# Patient Record
Sex: Female | Born: 1960
Health system: Southern US, Community
[De-identification: ages and names within clinical notes are randomized; demographics above are authoritative.]

## PROBLEM LIST (undated history)

## (undated) DIAGNOSIS — F32A Depression, unspecified: Secondary | ICD-10-CM

## (undated) DIAGNOSIS — M1990 Unspecified osteoarthritis, unspecified site: Secondary | ICD-10-CM

## (undated) DIAGNOSIS — I493 Ventricular premature depolarization: Secondary | ICD-10-CM

## (undated) DIAGNOSIS — IMO0001 Reserved for inherently not codable concepts without codable children: Secondary | ICD-10-CM

## (undated) DIAGNOSIS — I10 Essential (primary) hypertension: Secondary | ICD-10-CM

## (undated) DIAGNOSIS — M797 Fibromyalgia: Secondary | ICD-10-CM

## (undated) DIAGNOSIS — K589 Irritable bowel syndrome without diarrhea: Secondary | ICD-10-CM

## (undated) DIAGNOSIS — T8859XA Other complications of anesthesia, initial encounter: Secondary | ICD-10-CM

## (undated) DIAGNOSIS — J45909 Unspecified asthma, uncomplicated: Secondary | ICD-10-CM

## (undated) DIAGNOSIS — K219 Gastro-esophageal reflux disease without esophagitis: Secondary | ICD-10-CM

## (undated) DIAGNOSIS — F419 Anxiety disorder, unspecified: Secondary | ICD-10-CM

## (undated) DIAGNOSIS — M549 Dorsalgia, unspecified: Secondary | ICD-10-CM

## (undated) DIAGNOSIS — M4300 Spondylolysis, site unspecified: Secondary | ICD-10-CM

## (undated) DIAGNOSIS — K5792 Diverticulitis of intestine, part unspecified, without perforation or abscess without bleeding: Secondary | ICD-10-CM

## (undated) DIAGNOSIS — R112 Nausea with vomiting, unspecified: Secondary | ICD-10-CM

## (undated) DIAGNOSIS — Z9889 Other specified postprocedural states: Secondary | ICD-10-CM

## (undated) DIAGNOSIS — I499 Cardiac arrhythmia, unspecified: Secondary | ICD-10-CM

## (undated) HISTORY — PX: FINGER SURGERY: SHX640

## (undated) HISTORY — PX: CARPAL TUNNEL RELEASE: SHX101

## (undated) HISTORY — PX: KNEE SURGERY: SHX244

## (undated) HISTORY — PX: CHOLECYSTECTOMY: SHX55

## (undated) SURGERY — COLONOSCOPY
Anesthesia: Moderate Sedation

---

## 2000-10-17 ENCOUNTER — Emergency Department (HOSPITAL_COMMUNITY): Admission: EM | Admit: 2000-10-17 | Discharge: 2000-10-17 | Payer: Self-pay | Admitting: *Deleted

## 2000-10-17 ENCOUNTER — Encounter: Payer: Self-pay | Admitting: Emergency Medicine

## 2002-06-15 ENCOUNTER — Encounter: Payer: Self-pay | Admitting: Pediatrics

## 2002-06-15 ENCOUNTER — Ambulatory Visit (HOSPITAL_COMMUNITY): Admission: RE | Admit: 2002-06-15 | Discharge: 2002-06-15 | Payer: Self-pay | Admitting: Pediatrics

## 2004-05-10 ENCOUNTER — Ambulatory Visit (HOSPITAL_COMMUNITY): Admission: RE | Admit: 2004-05-10 | Discharge: 2004-05-10 | Payer: Self-pay | Admitting: Family Medicine

## 2004-11-11 ENCOUNTER — Ambulatory Visit (HOSPITAL_COMMUNITY): Admission: RE | Admit: 2004-11-11 | Discharge: 2004-11-11 | Payer: Self-pay | Admitting: Pediatrics

## 2005-08-07 ENCOUNTER — Ambulatory Visit (HOSPITAL_COMMUNITY): Admission: RE | Admit: 2005-08-07 | Discharge: 2005-08-07 | Payer: Self-pay | Admitting: Family Medicine

## 2005-12-15 ENCOUNTER — Emergency Department (HOSPITAL_COMMUNITY): Admission: EM | Admit: 2005-12-15 | Discharge: 2005-12-15 | Payer: Self-pay | Admitting: Emergency Medicine

## 2006-01-25 ENCOUNTER — Ambulatory Visit: Payer: Self-pay | Admitting: Internal Medicine

## 2006-01-26 ENCOUNTER — Ambulatory Visit: Payer: Self-pay | Admitting: Internal Medicine

## 2006-01-29 ENCOUNTER — Ambulatory Visit (HOSPITAL_COMMUNITY): Admission: RE | Admit: 2006-01-29 | Discharge: 2006-01-29 | Payer: Self-pay | Admitting: Internal Medicine

## 2006-03-10 ENCOUNTER — Ambulatory Visit: Payer: Self-pay | Admitting: Internal Medicine

## 2006-03-19 ENCOUNTER — Ambulatory Visit: Payer: Self-pay | Admitting: Internal Medicine

## 2006-03-19 ENCOUNTER — Ambulatory Visit (HOSPITAL_COMMUNITY): Admission: RE | Admit: 2006-03-19 | Discharge: 2006-03-19 | Payer: Self-pay | Admitting: Internal Medicine

## 2006-03-19 ENCOUNTER — Encounter (INDEPENDENT_AMBULATORY_CARE_PROVIDER_SITE_OTHER): Payer: Self-pay | Admitting: *Deleted

## 2006-04-09 ENCOUNTER — Ambulatory Visit: Payer: Self-pay | Admitting: Internal Medicine

## 2007-02-04 ENCOUNTER — Emergency Department (HOSPITAL_COMMUNITY): Admission: EM | Admit: 2007-02-04 | Discharge: 2007-02-04 | Payer: Self-pay | Admitting: Emergency Medicine

## 2007-03-18 ENCOUNTER — Other Ambulatory Visit: Admission: RE | Admit: 2007-03-18 | Discharge: 2007-03-18 | Payer: Self-pay | Admitting: Obstetrics & Gynecology

## 2007-09-02 ENCOUNTER — Emergency Department (HOSPITAL_COMMUNITY): Admission: EM | Admit: 2007-09-02 | Discharge: 2007-09-02 | Payer: Self-pay | Admitting: Emergency Medicine

## 2008-05-14 ENCOUNTER — Other Ambulatory Visit: Admission: RE | Admit: 2008-05-14 | Discharge: 2008-05-14 | Payer: Self-pay | Admitting: Obstetrics and Gynecology

## 2009-04-23 ENCOUNTER — Ambulatory Visit (HOSPITAL_COMMUNITY): Admission: RE | Admit: 2009-04-23 | Discharge: 2009-04-23 | Payer: Self-pay | Admitting: Pediatrics

## 2009-12-16 ENCOUNTER — Ambulatory Visit (HOSPITAL_COMMUNITY): Admission: RE | Admit: 2009-12-16 | Discharge: 2009-12-16 | Payer: Self-pay | Admitting: Pediatrics

## 2009-12-17 ENCOUNTER — Ambulatory Visit (HOSPITAL_COMMUNITY): Admission: RE | Admit: 2009-12-17 | Discharge: 2009-12-17 | Payer: Self-pay | Admitting: Pediatrics

## 2010-02-26 ENCOUNTER — Other Ambulatory Visit
Admission: RE | Admit: 2010-02-26 | Discharge: 2010-02-26 | Payer: Self-pay | Source: Home / Self Care | Admitting: Obstetrics & Gynecology

## 2010-03-10 ENCOUNTER — Encounter
Admission: RE | Admit: 2010-03-10 | Discharge: 2010-03-10 | Payer: Self-pay | Source: Home / Self Care | Attending: Obstetrics & Gynecology | Admitting: Obstetrics & Gynecology

## 2010-07-11 NOTE — Op Note (Signed)
NAME:  Heather Delgado, Heather Delgado                  ACCOUNT NO.:  [id number]   MEDICAL RECORD NO.:  [id number]          PATIENT TYPE:  AMB   LOCATION:  DAY                           FACILITY:  [facility]   PHYSICIAN:  Lionel December, M.D.    DATE OF BIRTH:  1960/06/22   DATE OF PROCEDURE:  03/19/2006  DATE OF DISCHARGE:                               OPERATIVE REPORT   PROCEDURE:  Colonoscopy with terminal ileoscopy.   INDICATIONS:  [name] is a 50 year old Caucasian female who was treated  for diverticulitis towards end of October, she required four weeks of  therapy.  Follow-up CT showed resolution of changes of diverticulitis  but she still remains with some discomfort and diarrhea.  She is  undergoing diagnostic colonoscopy.  Family history is negative for  colorectal carcinoma but positive for Crohn disease in her second-degree  relative.  Procedure risks were reviewed with the patient, informed  consent was obtained.   MEDS FOR CONSCIOUS SEDATION:  Demerol 50 mg IV, Versed 10 mg IV.   FINDINGS:  Procedure performed in endoscopy suite.  The patient's vital  signs and O2 sat were monitored during procedure and remained stable.  The patient was placed left lateral recumbent position and rectal  examination performed.  No abnormality noted on external or digital  exam.  Pentax videoscope was placed in rectum and advanced under vision  into sigmoid colon beyond.  Preparation was excellent.  She had  scattered diverticula at sigmoid and descending colon.  Scope was passed  into cecum which was identified by appendiceal orifice, ileocecal valve.  T I was examined for at least 10 cm and was normal.  As the scope was  withdrawn colonic mucosa was once again carefully examined and was  normal throughout.  Random biopsies taken from the sigmoid colon looking  for microscopic colitis.  Rectal mucosa was normal.  Scope was  retroflexed to examine anorectal junction which was unremarkable.  Endoscope was  straightened and withdrawn.  The patient tolerated the  procedure well.   FINAL DIAGNOSIS:  1. Few scattered diverticula at sigmoid and descending colon.  2. No evidence of colitis or ileitis.  3. Evidence of colitis or terminal ileitis.  4. Random biopsy taken from sigmoid colon looking for      microscopic/collagenous colitis.   RECOMMENDATIONS:  She will resume her usual meds and diet.  I will be  contacting patient with results of biopsy and further recommendations.      Lionel December, M.D.  Electronically Signed     NR/MEDQ  D:  03/19/2006  T:  03/19/2006  Job:  [id number]   cc:   ?

## 2010-07-11 NOTE — Consult Note (Signed)
NAME:  Heather Delgado, Heather Delgado                  ACCOUNT NO.:  [id number]   MEDICAL RECORD NO.:  [id number]           PATIENT TYPE:  AMB   LOCATION:                                FACILITY:  [facility]   PHYSICIAN:  Lionel December, M.D.    DATE OF BIRTH:  November 25, 1960   DATE OF CONSULTATION:  01/25/2006  DATE OF DISCHARGE:                                 CONSULTATION   PRESENTING COMPLAINT:  Persistent left-sided abdominal pain in a patient  recently treated for diverticulitis who also has diarrhea.   Heather Delgado is a 50 year old Caucasian female who is being evaluated at  request by Dr. Milford Cage, the patient's primary care physician.  The patient  states that she has had diarrhea since January this year.  She had stool  studies at that time by Dr. Santiago Bumpers which were all negative.  She  has been dealing with this symptom.  She states she would get better for  a few days, and she actually would have normal stools.  However, things  changed about 6 weeks ago when she developed excruciating pain along the  left side of her abdomen, mainly under the left rib cage.  She was seen  in emergency room at [facility] that night.  She had abdominopelvic CT which  revealed mild thickening to mid left colon with associated  diverticulosis, edema in the pericolonic fat.  These findings were felt  to be consistent with diverticulitis.  This study also showed a 3-mm  stone in lower pole of left kidney.  The patient was treated with Cipro  and Flagyl for 2 weeks.  She thought she was getting better.  Her pain  relapsed 1 day after stopping the medicine.  She was therefore retreated  with Cipro for 2 weeks, and it was followed by a 10-day course of  clindamycin which she finished about 10 days ago.  She states sharp pain  has resolved, but she still has soreness in her left mid and left upper  quadrant of her abdomen.  Previously, she was having intermittent  diarrhea, and now she is having seven to eight stools daily.  She has  had  nocturnal bowel movements, none in the last week.  Her stools are  loose.  She has had urgency, but she denies melena or rectal bleeding.  She had hemoccult done at time of examination by her gynecologist in  October and was negative.  She feels this attack was exacerbated by her  going on a low carb diet in an effort to lose weight which she did about  6 weeks prior to onset of this pain.  Her appetite was down, but now it  is back to normal.  She has intermittent heartburn usually associated  certain foods.  At times, she has taken medicines but not regularly.  She has nausea which she has had for years, and it reminds her of  morning sickness.  She denies vomiting, fever or chills.   She is on:  1. Benicar 40 mg daily.  2. Lasix 40 mg 3 times a week.  3. Bupropion SR 150 mg 2 a day.  4. Kariva birth control pills daily.  5. __________  tropical p.r.n., usually every other day.  6. Pepcid AC 10 mg q.h.s. p.r.n.  7. Compazine p.r.n.  8. Ventolin inhaler.  9. Azmacort p.r.n.Marland Kitchen   PAST MEDICAL HISTORY:  History of NASH.  Her initial biopsy was back in  July 1995 by Dr. Jena Gauss.  At that time, she had ERCP prior to it which  was normal.  She had a second liver biopsy in July 2000 at the time of  laparoscopic cholecystectomy, and once again, she had findings of NASH  with very fine filamentous intralobular fibrosis (chicken-wire pattern).   She has had three ERCPs, first one which was in July 1995 which was  normal.  She presented in July 2000 with biliary colic and transaminases  over 1000.  Her ERCP was normal.  It was felt that she may have passed a  small stone.  She had laparoscopic cholecystectomy following which she  had another ERCP with sphincterotomy and balloon passage.  In  retrospect, it is possible that she either had microlithiasis or  sphincter of Oddi dysfunction.  It is interesting that her transaminases  have remained normal since her sphincterotomy.   She has history  of hypoglycemia, depression, bronchial asthma and  autoimmune skin disease, i.e. lichen __________  involving perianal and  vulvar skin.  She uses a cream, usually treated four times a week.   She had left knee arthroscopy in 1995, C-section in 99 and  cholecystectomy in July 2000 as above.   ALLERGIES:  To Maxzide - caused almost anaphylaxis.  She was told that  it might have been due to sulfa in this medicine.   FAMILY HISTORY:  Mother has history of colonic polyps, and she is also  felt to have NASH.  Father is quadriplegic secondary to traumatic spinal  cord injury.  She has a brother with hypertension.  Her paternal uncle  has Crohn's disease.   She is married.  She has a daughter and a Museum/gallery conservator.  She works as a  Armed forces operational officer.  She has never smoked cigarettes and does not drink  alcohol.   PHYSICAL EXAMINATION:  Pleasant, well-developed, obese, Caucasian female  who is in no acute distress.  She weighs 294 pounds.  She is 5 feet 5  inches tall.  Pulse 92 per minute, blood pressure 138/90, temperature is  98.9.  Conjunctivae is pink.  Sclerae is nonicteric.  Oropharyngeal mucosa is  normal.  No neck masses are noted.  CARDIAC EXAM:  With a regular rhythm.  Normal S1-S2.  No murmur or  gallop noted.  LUNGS:  Clear to auscultation.  ABDOMEN:  Is protuberant.  Bowel sounds are normal.  On palpation, she  has mild tenderness at left lower and left upper quadrant without  guarding or rebound.  No masses or organomegaly noted.  RECTAL EXAMINATION:  Deferred.  She does not have peripheral edema or  clubbing.   Lab is not available for review, but she tells me her CBC and LFTs are  normal.   CT findings as above.   ASSESSMENT:  [name] is 50 year old Caucasian female who presents with  several month history of nonbloody diarrhea who was treated for  diverticulitis about 6 weeks ago but required over 5 weeks of antibiotic therapy.  Her severe pain has resolved, but she  still has some residual  discomfort on the left side of her abdomen.  Her  diarrhea has gotten  worse.  She received 4 weeks of Cipro and Flagyl and 10 days of  clindamycin.   As far as the chronic diarrhea is concerned, I suspect she has irritable  bowel syndrome.  Now that it has gotten worse and occurring daily, need  to rule out antibiotic-induced colitis, particularly considering  treatment with clindamycin.  She could also have inflammatory bowel  disease.   Since she is still having left-sided pain, I feel a follow-up CT is  indicated to make sure all of these changes have resolved prior to  considering colonoscopy.   Chronic/intermittent nausea, possibly unrelated to her GI problem.   History of NASH (biopsy-proven).  Transaminases have remained normal in  the last 6 or 7 years.   RECOMMENDATIONS:  1. Hemoccult x1.  2. Stool for WBC and Clostridium difficile toxin titer.  3. Abdominopelvic CT with oral and IV contrast for follow-up of      diverticulitis since she is still having discomfort and is mildly      tender.  4. Levbid 1 tablet every morning.  Prescription given for 30 with 5      refills.  5. Need and timing of colonoscopy will be deferred until above      completed and reviewed.   We would like to thank Dr. Milford Cage for the opportunity to participate in  the care of this nice lady.      Lionel December, M.D.  Electronically Signed     NR/MEDQ  D:  01/25/2006  T:  01/26/2006  Job:  [id number]   cc:   Francoise Schaumann. Milford Cage DO, FAAP  Fax: [phone]   [facility]  Fax: [phone]

## 2010-07-11 NOTE — H&P (Signed)
NAME:  [name], [name]                  ACCOUNT NO.:  [id number]   MEDICAL RECORD NO.:  [id number]          PATIENT TYPE:  AMB   LOCATION:  DAY                           FACILITY:  [facility]   PHYSICIAN:  Lionel December, M.D.    DATE OF BIRTH:  30-May-1960   DATE OF ADMISSION:  03/10/2006  DATE OF DISCHARGE:  LH                              HISTORY & PHYSICAL   PRESENTING COMPLAINT:  Follow-up for left-sided abdominal pain and  diverticulitis.   HISTORY OF PRESENT ILLNESS:  Heather Delgado is 50 year old Caucasian female who  was last seen on January 25, 2006 per request by Dr. Milford Cage for persistent  left sided pain and diarrhea in a patient who recently was treated for  diverticulitis.  She had received a total of 4 weeks of Cipro and Flagyl  and 10 days of clindamycin.  Her initial CT was on December 15, 2005.  She never had complete recovery.  It was felt that she may also have  IBS.  She had stool studies to rule out antibiotic induced colitis.  Her  C.  Diff toxin titer was negative and her lactoferrin was also negative.  She had negative Hemoccult.  She was begun on Levbid.  She had follow-up  CT on January 29, 2006 which revealed complete resolution of changes of  diverticulitis that were seen on prior study of December 15, 2005.  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  These findings were  reviewed with the patient over the phone last month.   She also had left ovarian cyst which measured 30 x 27 mm.  She also had  mild sigmoid diverticulosis.  Please note that this cyst was not seen  study of December 15, 2005.   Patient feels better.  She is still having some cramps when she has  diarrhea.  She may have diarrhea for a couple of days in a row and then  she has normal or semi-formed stool.  On days she has diarrhea.  She may  have six to eight BM's.  She denies melena or rectal bleeding.  She also  denies  fever or chills.  She ran out of her Levbid 2 days ago.  She can  tell a big difference.  She feels Levbid has helped her a great deal.  She had her pelvic exam 3-4 months ago which was normal by her  gynecologist in [place], [place].  She states she has been previously  evaluated for microscopic hematuria by Dr. Earlene Plater but the workup was  negative.  And now she has two tiny stones.   MEDICATIONS:  1. Benicar 40 mg daily.  2. Lasix 40 mg three times a week.  3. Bupropion SR 300 mg daily.  4. Kariva birth control pills daily.  5. Embeline topical cream p.r.n.  6. Pepcid AC one q.h.s.  7. Compazine p.r.n. dose unknown.  8. Ventolin inhaler 2 puffs q.i.d. p.r.n.  9. Azmacort inhaler p.r.n.  10.Levbid 1 tablet every morning.  11.Fiber supplement 3-4 grams  per day.   PAST MEDICAL HISTORY:  Please refer to my note of January 25, 2006 which  is on EMR and will not be repeated.   ALLERGIES:  To MAXZIDE, MERCURY and SULFA.   FAMILY HISTORY:  Family history positive for colonic polyps, NASH in  mother and paternal uncle has Crohn's disease.   SOCIAL HISTORY:  She is married.  She has biologic and a Museum/gallery conservator.  She works as a Armed forces operational officer.  She does not smoke cigarettes or drink  alcohol.   OBJECTIVE:  VITAL SIGNS:  Weight 297 pounds.  She is 5 feet 5 inches  tall.  Pulse 80 per minute, blood pressure of 136/88, temperature is  98.8.  HEENT:  Conjunctivae is pink and sclerae is anicteric.  No neck masses  are noted.  HEART:  Within normal limits.  ABDOMEN:  Her abdomen is obese, bowel sounds are normal on palpation. It  is soft and nontender without organomegaly or masses and no peripheral  edema noted.   ASSESSMENT:  1. Intermittent diarrhea and left-sided abdominal cramps felt to be      secondary to irritable bowel syndrome.  It appears as      diverticulitis has completely resolved as evidenced on follow-up CT      of January 25, 2006.  She had partial response to the  IBS therapy.  2. Left ovarian cyst.  She was given copy of CT report and she will      take it to her gynecologist in [place], [place].  3. Two small left renal calculi.  She will make arrangements to see      Dr. Earlene Plater.  We will send him the CT report.   PLAN:  Colonoscopy to be performed at [facility] in the near future.   She will go back on Levbid 1 tablet p.o. q.a.m., continue fiber  supplement at 3-4 grams per day.  Next loperamide 1-2 mg p.o. q.a.m.   The patient will call Dr. Earlene Plater' office for a visit regarding her renal  calculi.      Lionel December, M.D.  Electronically Signed     NR/MEDQ  D:  03/10/2006  T:  03/10/2006  Job:  [id number]   cc:   Francoise Schaumann. Milford Cage DO, FAAP  Fax: [phone]   Lucrezia Starch. Earlene Plater, M.D.  Fax: [phone]

## 2010-08-06 ENCOUNTER — Other Ambulatory Visit: Payer: Self-pay | Admitting: Obstetrics & Gynecology

## 2010-08-06 DIAGNOSIS — N63 Unspecified lump in unspecified breast: Secondary | ICD-10-CM

## 2010-09-05 ENCOUNTER — Ambulatory Visit
Admission: RE | Admit: 2010-09-05 | Discharge: 2010-09-05 | Disposition: A | Discharge status: Home / Self Care | Payer: BC Managed Care – PPO | Source: Ambulatory Visit | Attending: Obstetrics & Gynecology | Admitting: Obstetrics & Gynecology

## 2010-09-05 DIAGNOSIS — N63 Unspecified lump in unspecified breast: Secondary | ICD-10-CM

## 2010-11-20 LAB — CBC
HCT: 43.9
MCHC: 34.6
MCV: 87.9
Platelets: 299
RDW: 13.2
WBC: 6.8

## 2010-11-20 LAB — URINE MICROSCOPIC-ADD ON

## 2010-11-20 LAB — DIFFERENTIAL
Basophils Absolute: 0
Eosinophils Relative: 3
Lymphocytes Relative: 32
Lymphs Abs: 2.2
Monocytes Absolute: 0.6
Monocytes Relative: 9
Neutro Abs: 3.8

## 2010-11-20 LAB — COMPREHENSIVE METABOLIC PANEL
AST: 26
Albumin: 3.9
BUN: 13
Calcium: 9.1
Chloride: 106
Creatinine, Ser: 0.83
GFR calc Af Amer: >60
Total Protein: 6.6

## 2010-11-20 LAB — URINALYSIS, ROUTINE W REFLEX MICROSCOPIC
Glucose, UA: NEGATIVE
Ketones, ur: NEGATIVE
Leukocytes, UA: NEGATIVE
Specific Gravity, Urine: 1.002 — ABNORMAL LOW
pH: 7

## 2011-02-19 ENCOUNTER — Other Ambulatory Visit: Payer: Self-pay | Admitting: Obstetrics & Gynecology

## 2011-02-19 DIAGNOSIS — N6489 Other specified disorders of breast: Secondary | ICD-10-CM

## 2011-03-16 ENCOUNTER — Ambulatory Visit
Admission: RE | Admit: 2011-03-16 | Discharge: 2011-03-16 | Disposition: A | Discharge status: Home / Self Care | Payer: BC Managed Care – PPO | Source: Ambulatory Visit | Attending: Obstetrics & Gynecology | Admitting: Obstetrics & Gynecology

## 2011-03-16 DIAGNOSIS — N6489 Other specified disorders of breast: Secondary | ICD-10-CM

## 2011-04-01 ENCOUNTER — Other Ambulatory Visit: Payer: Self-pay | Admitting: Obstetrics & Gynecology

## 2011-04-01 ENCOUNTER — Other Ambulatory Visit (HOSPITAL_COMMUNITY)
Admission: RE | Admit: 2011-04-01 | Discharge: 2011-04-01 | Disposition: A | Discharge status: Home / Self Care | Payer: BC Managed Care – PPO | Source: Ambulatory Visit | Attending: Obstetrics & Gynecology | Admitting: Obstetrics & Gynecology

## 2011-04-01 DIAGNOSIS — Z01419 Encounter for gynecological examination (general) (routine) without abnormal findings: Secondary | ICD-10-CM | POA: Insufficient documentation

## 2011-09-07 ENCOUNTER — Emergency Department (HOSPITAL_COMMUNITY)
Admission: EM | Admit: 2011-09-07 | Discharge: 2011-09-07 | Disposition: A | Discharge status: Home / Self Care | Payer: BC Managed Care – PPO | Attending: Emergency Medicine | Admitting: Emergency Medicine

## 2011-09-07 ENCOUNTER — Emergency Department (HOSPITAL_COMMUNITY): Payer: BC Managed Care – PPO

## 2011-09-07 ENCOUNTER — Encounter (HOSPITAL_COMMUNITY): Payer: Self-pay | Admitting: *Deleted

## 2011-09-07 DIAGNOSIS — R5383 Other fatigue: Secondary | ICD-10-CM | POA: Insufficient documentation

## 2011-09-07 DIAGNOSIS — K219 Gastro-esophageal reflux disease without esophagitis: Secondary | ICD-10-CM | POA: Insufficient documentation

## 2011-09-07 DIAGNOSIS — R0602 Shortness of breath: Secondary | ICD-10-CM | POA: Insufficient documentation

## 2011-09-07 DIAGNOSIS — R51 Headache: Secondary | ICD-10-CM | POA: Insufficient documentation

## 2011-09-07 DIAGNOSIS — I10 Essential (primary) hypertension: Secondary | ICD-10-CM | POA: Insufficient documentation

## 2011-09-07 DIAGNOSIS — R079 Chest pain, unspecified: Secondary | ICD-10-CM | POA: Insufficient documentation

## 2011-09-07 DIAGNOSIS — Z7982 Long term (current) use of aspirin: Secondary | ICD-10-CM | POA: Insufficient documentation

## 2011-09-07 DIAGNOSIS — Z79899 Other long term (current) drug therapy: Secondary | ICD-10-CM | POA: Insufficient documentation

## 2011-09-07 DIAGNOSIS — R002 Palpitations: Secondary | ICD-10-CM | POA: Insufficient documentation

## 2011-09-07 DIAGNOSIS — R5381 Other malaise: Secondary | ICD-10-CM | POA: Insufficient documentation

## 2011-09-07 HISTORY — DX: Gastro-esophageal reflux disease without esophagitis: K21.9

## 2011-09-07 HISTORY — DX: Diverticulitis of intestine, part unspecified, without perforation or abscess without bleeding: K57.92

## 2011-09-07 HISTORY — DX: Dorsalgia, unspecified: M54.9

## 2011-09-07 HISTORY — DX: Essential (primary) hypertension: I10

## 2011-09-07 HISTORY — DX: Reserved for inherently not codable concepts without codable children: IMO0001

## 2011-09-07 LAB — CARDIAC PANEL(CRET KIN+CKTOT+MB+TROPI)
CK, MB: 1.8 ng/mL (ref 0.3–4.0)
Relative Index: INVALID (ref 0.0–2.5)
Total CK: 38 U/L (ref 7–177)
Troponin I: <0.3 ng/mL (ref <0.30)

## 2011-09-07 LAB — CBC WITH DIFFERENTIAL/PLATELET
Basophils Absolute: 0 10*3/uL (ref 0.0–0.1)
Basophils Relative: 0 % (ref 0–1)
Eosinophils Absolute: 0.1 10*3/uL (ref 0.0–0.7)
Eosinophils Relative: 1 % (ref 0–5)
Lymphocytes Relative: 24 % (ref 12–46)
MCHC: 33.4 g/dL (ref 30.0–36.0)
MCV: 86.7 fL (ref 78.0–100.0)
Monocytes Absolute: 0.7 10*3/uL (ref 0.1–1.0)
Platelets: 304 10*3/uL (ref 150–400)
RDW: 13.7 % (ref 11.5–15.5)
WBC: 9.3 10*3/uL (ref 4.0–10.5)

## 2011-09-07 LAB — COMPREHENSIVE METABOLIC PANEL
ALT: 23 U/L (ref 0–35)
AST: 17 U/L (ref 0–37)
Albumin: 3.4 g/dL — ABNORMAL LOW (ref 3.5–5.2)
CO2: 27 mEq/L (ref 19–32)
Calcium: 9.9 mg/dL (ref 8.4–10.5)
Creatinine, Ser: 0.55 mg/dL (ref 0.50–1.10)
Sodium: 139 mEq/L (ref 135–145)
Total Protein: 6.9 g/dL (ref 6.0–8.3)

## 2011-09-07 LAB — RAPID URINE DRUG SCREEN, HOSP PERFORMED
Barbiturates: NOT DETECTED
Benzodiazepines: NOT DETECTED

## 2011-09-07 NOTE — ED Provider Notes (Signed)
History     CSN: [id number]  Arrival date & time 09/07/11  1327   First MD Initiated Contact with Patient 09/07/11 1348      Chief Complaint  Patient presents with  . Palpitations    (Consider location/radiation/quality/duration/timing/severity/associated sxs/prior treatment) HPI Comments: Intermittent palpitations and "heart fluttering" for the past week. Improved with decreasing caffeine intake but did not resolve. Patient wonders if steroid injections and knees but may be responsible. She denies any nausea or vomiting. She has mild shortness of breath and feels some anterior chest pain with deep breathing. She had fleeting seconds of left clavicle pain intermittently today to have resolved. She denies any history of heart disease or lung disease. She has been on thyroid medication in the past was told she could stop at several years ago.  The history is provided by the patient.    Past Medical History  Diagnosis Date  . Hypertension   . Reflux   . Diverticulitis   . Back pain     Past Surgical History  Procedure Date  . Cholecystectomy   . Cesarean section   . Knee surgery     History reviewed. No pertinent family history.  History  Substance Use Topics  . Smoking status: Never Smoker   . Smokeless tobacco: Not on file  . Alcohol Use: No    OB History    Grav Para Term Preterm Abortions TAB SAB Ect Mult Living                  Review of Systems  Constitutional: Positive for fatigue. Negative for fever, activity change and appetite change.  HENT: Negative for congestion and rhinorrhea.   Respiratory: Negative for cough, chest tightness and shortness of breath.   Cardiovascular: Positive for palpitations. Negative for chest pain.  Gastrointestinal: Negative for nausea, vomiting and abdominal pain.  Genitourinary: Negative for dysuria, vaginal bleeding and vaginal discharge.  Musculoskeletal: Negative for back pain.  Neurological: Positive for weakness and  headaches. Negative for dizziness and light-headedness.    Allergies  Sulfa antibiotics; Triamterene; and Other  Home Medications   Current Outpatient Rx  Name Route Sig Dispense Refill  . ASPIRIN EC 81 MG PO TBEC Oral Take 81 mg by mouth daily.    Marland Kitchen CLOBETASOL PROPIONATE 0.05 % EX OINT Topical Apply 1 application topically daily as needed. Likens Disease    . DULOXETINE HCL 60 MG PO CPEP Oral Take 60 mg by mouth daily.    Marland Kitchen ESOMEPRAZOLE MAGNESIUM 40 MG PO CPDR Oral Take 40 mg by mouth at bedtime.    Marland Kitchen LISINOPRIL 20 MG PO TABS Oral Take 20 mg by mouth at bedtime.    Marland Kitchen LORATADINE 10 MG PO TABS Oral Take 10 mg by mouth at bedtime.    Marland Kitchen MAGNESIUM 250 MG PO TABS Oral Take 1 tablet by mouth daily.    . OSTEO BI-FLEX ADV TRIPLE ST PO TABS Oral Take 1 tablet by mouth daily.    Marland Kitchen PRESCRIPTION MEDICATION Injection Inject as directed once. Injection given in knee.    Marland Kitchen ZOLPIDEM TARTRATE 10 MG PO TABS Oral Take 10 mg by mouth at bedtime as needed. Sleep      BP 125/61  Pulse 93  Temp 98 F (36.7 C) (Oral)  Resp 16  Ht 5\' 5"  (1.651 m)  Wt 307 lb (139.254 kg)  BMI 51.09 kg/m2  SpO2 99%  Physical Exam  Constitutional: She is oriented to person, place, and time. She appears  well-developed and well-nourished. No distress.  HENT:  Head: Normocephalic and atraumatic.  Mouth/Throat: Oropharynx is clear and moist. No oropharyngeal exudate.  Eyes: Conjunctivae and EOM are normal. Pupils are equal, round, and reactive to light.  Neck: Normal range of motion. Neck supple.  Cardiovascular: Normal rate, regular rhythm and normal heart sounds.   No murmur heard. Pulmonary/Chest: Effort normal and breath sounds normal. No respiratory distress.  Abdominal: Soft. There is no tenderness. There is no rebound and no guarding.  Musculoskeletal: Normal range of motion. She exhibits no edema and no tenderness.  Neurological: She is alert and oriented to person, place, and time. No cranial nerve deficit.    Skin: Skin is warm.    ED Course  Procedures (including critical care time)  Labs Reviewed  CBC WITH DIFFERENTIAL - Abnormal; Notable for the following:    RBC 5.18 (*)     All other components within normal limits  COMPREHENSIVE METABOLIC PANEL - Abnormal; Notable for the following:    Glucose, Bld 123 (*)     Albumin 3.4 (*)     All other components within normal limits  D-DIMER, QUANTITATIVE  CARDIAC PANEL(CRET KIN+CKTOT+MB+TROPI)  URINE RAPID DRUG SCREEN (HOSP PERFORMED)  TSH  T4, FREE   No results found.   1. Palpitations       MDM  Intermittent palpitations for the past several days improved after discontinuing caffeine. Recent steroid injections in both knees. Slight shortness of breath and pain with deep breathing. Fleeting one second of atypical left clavicle pain several times today.  EKG nonischemic, chest x-ray negative D-dimer negative, cardiac enzymes negative. X-ray negative.  Patient stable for outpatient follow up with PCP and cardiology referral as needed. Instructed she'll be will be called for abnormal thyroid function results. Continue to avoid caffeine.    Date: 09/07/2011  Rate: 100  Rhythm: normal sinus rhythm  QRS Axis: normal  Intervals: normal  ST/T Wave abnormalities: normal  Conduction Disutrbances:none  Narrative Interpretation:   Old EKG Reviewed: none available    Glynn Octave, MD 09/07/11 1545

## 2011-09-07 NOTE — ED Notes (Signed)
Palpitations for 6 days.  Recently had steroid injections in both knees, and pt thought it may be causing palpitations.  Slight intermittent chest pain.No N/V.  Sl sob

## 2011-09-08 LAB — T4, FREE: Free T4: 1.05 ng/dL (ref 0.80–1.80)

## 2011-09-08 LAB — TSH: TSH: 0.945 u[IU]/mL (ref 0.350–4.500)

## 2011-12-29 ENCOUNTER — Inpatient Hospital Stay (HOSPITAL_COMMUNITY)
Admission: EM | Admit: 2011-12-29 | Discharge: 2012-01-01 | DRG: 229 | Disposition: A | Discharge status: Home / Self Care | Payer: BC Managed Care – PPO | Attending: Orthopedic Surgery | Admitting: Orthopedic Surgery

## 2011-12-29 ENCOUNTER — Emergency Department (HOSPITAL_COMMUNITY): Payer: BC Managed Care – PPO

## 2011-12-29 ENCOUNTER — Encounter (HOSPITAL_COMMUNITY): Payer: Self-pay | Admitting: *Deleted

## 2011-12-29 DIAGNOSIS — M65839 Other synovitis and tenosynovitis, unspecified forearm: Principal | ICD-10-CM | POA: Diagnosis present

## 2011-12-29 DIAGNOSIS — IMO0002 Reserved for concepts with insufficient information to code with codable children: Secondary | ICD-10-CM

## 2011-12-29 DIAGNOSIS — I10 Essential (primary) hypertension: Secondary | ICD-10-CM | POA: Diagnosis present

## 2011-12-29 DIAGNOSIS — Z6841 Body Mass Index (BMI) 40.0 and over, adult: Secondary | ICD-10-CM

## 2011-12-29 DIAGNOSIS — L02519 Cutaneous abscess of unspecified hand: Secondary | ICD-10-CM | POA: Diagnosis present

## 2011-12-29 DIAGNOSIS — L03019 Cellulitis of unspecified finger: Secondary | ICD-10-CM | POA: Diagnosis present

## 2011-12-29 DIAGNOSIS — B950 Streptococcus, group A, as the cause of diseases classified elsewhere: Secondary | ICD-10-CM | POA: Diagnosis present

## 2011-12-29 DIAGNOSIS — F411 Generalized anxiety disorder: Secondary | ICD-10-CM | POA: Diagnosis present

## 2011-12-29 DIAGNOSIS — L988 Other specified disorders of the skin and subcutaneous tissue: Secondary | ICD-10-CM | POA: Diagnosis present

## 2011-12-29 DIAGNOSIS — M65849 Other synovitis and tenosynovitis, unspecified hand: Principal | ICD-10-CM | POA: Diagnosis present

## 2011-12-29 HISTORY — DX: Unspecified osteoarthritis, unspecified site: M19.90

## 2011-12-29 HISTORY — DX: Spondylolysis, site unspecified: M43.00

## 2011-12-29 HISTORY — PX: INFECTED SKIN DEBRIDEMENT: SHX678

## 2011-12-29 HISTORY — DX: Unspecified asthma, uncomplicated: J45.909

## 2011-12-29 LAB — BASIC METABOLIC PANEL
CO2: 29 mEq/L (ref 19–32)
Chloride: 101 mEq/L (ref 96–112)
Creatinine, Ser: 1.04 mg/dL (ref 0.50–1.10)
Glucose, Bld: 106 mg/dL — ABNORMAL HIGH (ref 70–99)
Sodium: 139 mEq/L (ref 135–145)

## 2011-12-29 LAB — CBC WITH DIFFERENTIAL/PLATELET
Basophils Absolute: 0.1 10*3/uL (ref 0.0–0.1)
Eosinophils Relative: 3 % (ref 0–5)
HCT: 41.9 % (ref 36.0–46.0)
Lymphocytes Relative: 34 % (ref 12–46)
Lymphs Abs: 3.1 10*3/uL (ref 0.7–4.0)
MCV: 88.2 fL (ref 78.0–100.0)
Monocytes Absolute: 0.9 10*3/uL (ref 0.1–1.0)
Neutro Abs: 4.9 10*3/uL (ref 1.7–7.7)
RBC: 4.75 MIL/uL (ref 3.87–5.11)
RDW: 13.2 % (ref 11.5–15.5)
WBC: 9.2 10*3/uL (ref 4.0–10.5)

## 2011-12-29 MED ORDER — VANCOMYCIN HCL IN DEXTROSE 1-5 GM/200ML-% IV SOLN
1000.0000 mg | Freq: Once | INTRAVENOUS | Status: AC
  Administered 2011-12-29: 1000 mg via INTRAVENOUS
  Filled 2011-12-29: qty 200

## 2011-12-29 NOTE — ED Notes (Signed)
MD requested to leave IV in patient at transfer. Patient is transferring to [facility] via POV. Patient has 20g in L antecubital area, heplocked. Wrapped IV site with gauze wrap and advised patient to keep gauze in place until arrival to [facility]. Patient verbalized understanding. Patient ambulated with no assistance at discharge with daughter to drive her to [facility].

## 2011-12-29 NOTE — ED Notes (Signed)
Patient right first finger red and swollen. Open wound on inside of finger draining blood and yellow pus per patient. Denies injury. States she has been on antibiotics x 5 days and wound has been off and on getting better then worse since then.

## 2011-12-29 NOTE — ED Provider Notes (Signed)
Heather Delgado is a 51 y.o. female who was seen by Dr. Adriana Simas, and he asked me to evaluate her, then talked to the hand surgeon. She relates having "hangnail on the right index finger, about 9 days ago. Several days later the whole finger swelled up, and she developed a red streak up her arm. She was seen by her PCP 5 days ago, and he placed her on Keflex.. the finger, swelling, and red streak, improved; then, 2 days ago. The finger became more swollen, and red, distally. Currently, is draining from the volar pad.   On exam. She has a sausage- like, right second finger, with redness, and several small areas of drainage on the volar aspect. There is also a paronychia with cuticle, drainage on the ulnar aspect. She resists flexion of the PIP and PIP secondary to pain. There is no erythema. Along the tendon sheath proximal to the PIP joint. Her pain is moderate. There is no lymphangitic streaking. There are no epitrochlear nodes.  Medical decision: Right second finger, with clinical findings for felon. Imaging did not reveal osteomyelitis.  Nursing notes, applicable records and vitals reviewed.  Radiologic Images/Reports reviewed.     Flint Melter, MD 01/05/12 [phone]

## 2011-12-29 NOTE — ED Provider Notes (Signed)
History     CSN: [id number]  Arrival date & time 12/29/11  4696   First MD Initiated Contact with Patient 12/29/11 1959      Chief Complaint  Patient presents with  . Recurrent Skin Infections    (Consider location/radiation/quality/duration/timing/severity/associated sxs/prior treatment) HPI.......Marland Kitchenredness and swelling of right index finger for several days. Seen by primary care Dr. On Friday and placed on Keflex. Fever past Thursday. No shaking chills. Severity is moderate. No radiation of pain.  Past Medical History  Diagnosis Date  . Hypertension   . Reflux   . Diverticulitis   . Back pain     Past Surgical History  Procedure Date  . Cholecystectomy   . Cesarean section   . Knee surgery     No family history on file.  History  Substance Use Topics  . Smoking status: Never Smoker   . Smokeless tobacco: Not on file  . Alcohol Use: No    OB History    Grav Para Term Preterm Abortions TAB SAB Ect Mult Living                  Review of Systems  All other systems reviewed and are negative.    Allergies  Sulfa antibiotics; Triamterene; and Other  Home Medications   Current Outpatient Rx  Name  Route  Sig  Dispense  Refill  . ATENOLOL 25 MG PO TABS   Oral   Take 25 mg by mouth every evening. For palpitations         . CEPHALEXIN 500 MG PO CAPS   Oral   Take 500 mg by mouth 3 (three) times daily. For 7 days         . VITAMIN D3 2000 UNITS PO TABS   Oral   Take 1 tablet by mouth daily.         Marland Kitchen CLOBETASOL PROPIONATE 0.05 % EX OINT   Topical   Apply 1 application topically daily as needed. Likens Disease         . DULOXETINE HCL 60 MG PO CPEP   Oral   Take 60 mg by mouth every morning.          Marland Kitchen ESOMEPRAZOLE MAGNESIUM 40 MG PO CPDR   Oral   Take 40 mg by mouth at bedtime.         Marland Kitchen HYDROCODONE-ACETAMINOPHEN 5-500 MG PO TABS   Oral   Take 1-2 tablets by mouth every 6 (six) hours as needed. For pain         . IBUPROFEN 200  MG PO TABS   Oral   Take 400 mg by mouth every 6 (six) hours as needed. For pain         . LISINOPRIL 20 MG PO TABS   Oral   Take 20 mg by mouth at bedtime.         Marland Kitchen LORATADINE 10 MG PO TABS   Oral   Take 10 mg by mouth at bedtime.         . OSTEO BI-FLEX ADV TRIPLE ST PO TABS   Oral   Take 1 tablet by mouth daily.         . THRIVE FOR LIFE WOMENS PO TABS   Oral   Take 1 tablet by mouth daily.         Marland Kitchen PROBIOTIC FORMULA PO   Oral   Take 1 capsule by mouth at bedtime. INGREDIENTS: FOS (Fructooligosaccharides) 50 mg *  ConcenTrace  Trace Mineral Complex  (from the [facility], 72 naturally occurring minerals, plus other minerals found in seawater) 12.5 mg *  Bifidobacterium longum 430 million viable organisms? *  Lactobacillus acidophilus 430 million viable organisms? *  Bifidobacterium bifidum 180 million viable organisms? *  Bifidobacterium breve 180 million viable organisms? *  Bifidobacterium lactis 180 million viable organisms? *  Lactobacillus brevis 180 million viable organisms? *  Lactobacillus bulgaricus 180 million viable organisms? *  Lactobacillus casei 180 million viable organisms? *  Lactobacillus helveticus 180 million viable organisms? *  Lactobacillus plantarum 180 million viable organisms? *  Lactobacillus rhamnosus  180 million viable organisms? *  Lactobacillus salivarius  180 million viable organisms? *  Lactococcus lactis  180 million viable organisms? *  Streptococcus thermophilus  180 million viable organisms? *  Bifidobacterium infantis  90 million viable organisms? *   *Daily Value not established.  ?Colony-Forming Units (live bacteria) at time of manufacture.   Other ingredients: Microcrystalline cellulose (plant fiber), hypromellose (vegetable capsule, gellan gum), alfalfa, may contain one or both of the following: magnesium stearate, silica.   Suggested Use: As a dietary supplement, take one veggie capsule per day with water  or juice.         Marland Kitchen ZOLPIDEM TARTRATE 10 MG PO TABS   Oral   Take 10 mg by mouth at bedtime as needed. Sleep           BP 128/84  Pulse 79  Temp 98.3 F (36.8 C) (Oral)  Resp 18  SpO2 97%  Physical Exam  Nursing note and vitals reviewed. Constitutional: She is oriented to person, place, and time.       Morbidly obese, no acute distress  HENT:  Head: Normocephalic and atraumatic.  Eyes: Conjunctivae normal and EOM are normal. Pupils are equal, round, and reactive to light.  Neck: Normal range of motion. Neck supple.  Cardiovascular: Normal rate, regular rhythm and normal heart sounds.   Pulmonary/Chest: Effort normal and breath sounds normal.  Abdominal: Soft. Bowel sounds are normal.  Musculoskeletal:       Right hand:  Ring and index finger reveals erythema, induration, puffiness from PIP joint circumferentially distally.  Unable to flex at the PIP or DIP joint  Neurological: She is alert and oriented to person, place, and time.  Skin: Skin is warm and dry.  Psychiatric: She has a normal mood and affect.    ED Course  Procedures (including critical care time)  Labs Reviewed  BASIC METABOLIC PANEL - Abnormal; Notable for the following:    Glucose, Bld 106 (*)     GFR calc non Af Amer 62 (*)     GFR calc Af Amer 71 (*)     All other components within normal limits  CBC WITH DIFFERENTIAL   Dg Finger Index Right  12/29/2011  *RADIOLOGY REPORT*  Clinical Data: Right index finger swelling over the past 5 days. Tenosynovitis.  Evaluate for osteomyelitis.  RIGHT INDEX FINGER 2+V  Comparison: None.  Findings: Diffuse soft tissue swelling.  No evidence of osteomyelitis.  No acute or subacute fractures.  Well-preserved joint spaces.  Well-preserved bone mineral density.  IMPRESSION: Diffuse soft tissue swelling.  No osseous abnormality.   Original Report Authenticated By: Hulan Saas, M.D.      No diagnosis found.    MDM  IV vancomycin. Consult hand  surgeon        Donnetta Hutching, MD 12/29/11 2152

## 2011-12-29 NOTE — ED Notes (Signed)
Right index finger swollen and red

## 2011-12-30 ENCOUNTER — Encounter (HOSPITAL_COMMUNITY): Payer: Self-pay | Admitting: Anesthesiology

## 2011-12-30 ENCOUNTER — Encounter (HOSPITAL_COMMUNITY): Payer: Self-pay | Admitting: General Practice

## 2011-12-30 ENCOUNTER — Encounter (HOSPITAL_COMMUNITY)
Admission: EM | Disposition: A | Discharge status: Home / Self Care | Payer: Self-pay | Source: Home / Self Care | Attending: Orthopedic Surgery

## 2011-12-30 ENCOUNTER — Observation Stay (HOSPITAL_COMMUNITY): Payer: BC Managed Care – PPO | Admitting: Anesthesiology

## 2011-12-30 HISTORY — PX: I&D EXTREMITY: SHX5045

## 2011-12-30 SURGERY — IRRIGATION AND DEBRIDEMENT EXTREMITY
Anesthesia: General | Site: Finger | Laterality: Right | Wound class: Dirty or Infected

## 2011-12-30 MED ORDER — PROMETHAZINE HCL 25 MG RE SUPP: 12.5000 mg | Freq: Four times a day (QID) | RECTAL | Status: DC | PRN

## 2011-12-30 MED ORDER — ONDANSETRON HCL 4 MG PO TABS: 4.0000 mg | ORAL_TABLET | Freq: Four times a day (QID) | ORAL | Status: DC | PRN

## 2011-12-30 MED ORDER — DULOXETINE HCL 60 MG PO CPEP
60.0000 mg | ORAL_CAPSULE | Freq: Every morning | ORAL | Status: DC
  Administered 2011-12-30 – 2012-01-01 (×3): 60 mg via ORAL
  Filled 2011-12-30 (×3): qty 1

## 2011-12-30 MED ORDER — HYDROMORPHONE HCL PF 1 MG/ML IJ SOLN
INTRAMUSCULAR | Status: AC
  Filled 2011-12-30: qty 1

## 2011-12-30 MED ORDER — PANTOPRAZOLE SODIUM 40 MG PO TBEC
40.0000 mg | DELAYED_RELEASE_TABLET | Freq: Every day | ORAL | Status: DC
  Administered 2011-12-30 – 2012-01-01 (×3): 40 mg via ORAL
  Filled 2011-12-30 (×3): qty 1

## 2011-12-30 MED ORDER — LISINOPRIL 20 MG PO TABS
20.0000 mg | ORAL_TABLET | Freq: Every day | ORAL | Status: DC
  Administered 2011-12-30 – 2011-12-31 (×2): 20 mg via ORAL
  Filled 2011-12-30 (×3): qty 1

## 2011-12-30 MED ORDER — MORPHINE SULFATE 2 MG/ML IJ SOLN: 1.0000 mg | INTRAMUSCULAR | Status: DC | PRN

## 2011-12-30 MED ORDER — SODIUM CHLORIDE 0.9 % IR SOLN
Status: DC | PRN
  Administered 2011-12-30: 3000 mL

## 2011-12-30 MED ORDER — PIPERACILLIN-TAZOBACTAM 3.375 G IVPB
3.3750 g | Freq: Three times a day (TID) | INTRAVENOUS | Status: DC
  Administered 2011-12-30 – 2012-01-01 (×6): 3.375 g via INTRAVENOUS
  Filled 2011-12-30 (×9): qty 50

## 2011-12-30 MED ORDER — DOCUSATE SODIUM 100 MG PO CAPS
100.0000 mg | ORAL_CAPSULE | Freq: Two times a day (BID) | ORAL | Status: DC
  Administered 2011-12-30 – 2012-01-01 (×5): 100 mg via ORAL
  Filled 2011-12-30 (×6): qty 1

## 2011-12-30 MED ORDER — BACITRACIN-NEOMYCIN-POLYMYXIN 400-5-5000 EX OINT
TOPICAL_OINTMENT | CUTANEOUS | Status: DC | PRN
  Administered 2011-12-30: 1 via TOPICAL

## 2011-12-30 MED ORDER — LIDOCAINE HCL (CARDIAC) 20 MG/ML IV SOLN
INTRAVENOUS | Status: DC | PRN
  Administered 2011-12-30: 60 mg via INTRAVENOUS

## 2011-12-30 MED ORDER — ONDANSETRON HCL 4 MG/2ML IJ SOLN: 4.0000 mg | Freq: Four times a day (QID) | INTRAMUSCULAR | Status: DC | PRN

## 2011-12-30 MED ORDER — METHOCARBAMOL 100 MG/ML IJ SOLN
500.0000 mg | Freq: Four times a day (QID) | INTRAVENOUS | Status: DC | PRN
  Filled 2011-12-30: qty 5

## 2011-12-30 MED ORDER — VITAMIN D3 25 MCG (1000 UNIT) PO TABS
2000.0000 [IU] | ORAL_TABLET | Freq: Every day | ORAL | Status: DC
  Administered 2011-12-30 – 2012-01-01 (×3): 2000 [IU] via ORAL
  Filled 2011-12-30 (×3): qty 2

## 2011-12-30 MED ORDER — SUCCINYLCHOLINE CHLORIDE 20 MG/ML IJ SOLN
INTRAMUSCULAR | Status: DC | PRN
  Administered 2011-12-30: 100 mg via INTRAVENOUS

## 2011-12-30 MED ORDER — LACTATED RINGERS IV SOLN
INTRAVENOUS | Status: DC
  Administered 2011-12-30 – 2012-01-01 (×4): via INTRAVENOUS

## 2011-12-30 MED ORDER — BUPIVACAINE HCL (PF) 0.25 % IJ SOLN
INTRAMUSCULAR | Status: AC
  Filled 2011-12-30: qty 30

## 2011-12-30 MED ORDER — ATENOLOL 25 MG PO TABS
25.0000 mg | ORAL_TABLET | Freq: Every evening | ORAL | Status: DC
  Administered 2011-12-30 – 2011-12-31 (×2): 25 mg via ORAL
  Filled 2011-12-30 (×3): qty 1

## 2011-12-30 MED ORDER — BACITRACIN-NEOMYCIN-POLYMYXIN 400-5-5000 EX OINT
TOPICAL_OINTMENT | CUTANEOUS | Status: AC
  Filled 2011-12-30: qty 1

## 2011-12-30 MED ORDER — VITAMIN C 500 MG PO TABS
1000.0000 mg | ORAL_TABLET | Freq: Every day | ORAL | Status: DC
  Administered 2011-12-30 – 2012-01-01 (×3): 1000 mg via ORAL
  Filled 2011-12-30 (×3): qty 2

## 2011-12-30 MED ORDER — HYDROMORPHONE HCL PF 1 MG/ML IJ SOLN
0.2500 mg | INTRAMUSCULAR | Status: DC | PRN
  Administered 2011-12-30 (×3): 0.5 mg via INTRAVENOUS

## 2011-12-30 MED ORDER — PROPOFOL 10 MG/ML IV BOLUS
INTRAVENOUS | Status: DC | PRN
  Administered 2011-12-30: 200 mg via INTRAVENOUS
  Administered 2011-12-30: 80 mg via INTRAVENOUS

## 2011-12-30 MED ORDER — METHOCARBAMOL 500 MG PO TABS: 500.0000 mg | ORAL_TABLET | Freq: Four times a day (QID) | ORAL | Status: DC | PRN

## 2011-12-30 MED ORDER — OXYCODONE HCL 5 MG PO TABS
5.0000 mg | ORAL_TABLET | ORAL | Status: DC | PRN
  Administered 2011-12-30: 10 mg via ORAL
  Administered 2011-12-30 – 2011-12-31 (×2): 5 mg via ORAL
  Filled 2011-12-30 (×2): qty 1
  Filled 2011-12-30: qty 2

## 2011-12-30 MED ORDER — MIDAZOLAM HCL 5 MG/5ML IJ SOLN
INTRAMUSCULAR | Status: DC | PRN
  Administered 2011-12-30: 2 mg via INTRAVENOUS

## 2011-12-30 MED ORDER — LORATADINE 10 MG PO TABS
10.0000 mg | ORAL_TABLET | Freq: Every day | ORAL | Status: DC
  Administered 2011-12-30 – 2011-12-31 (×2): 10 mg via ORAL
  Filled 2011-12-30 (×4): qty 1

## 2011-12-30 MED ORDER — LACTATED RINGERS IV SOLN
INTRAVENOUS | Status: DC | PRN
  Administered 2011-12-30: 01:00:00 via INTRAVENOUS

## 2011-12-30 MED ORDER — VITAMIN D3 50 MCG (2000 UT) PO TABS: 1.0000 | ORAL_TABLET | Freq: Every day | ORAL | Status: DC

## 2011-12-30 MED ORDER — SODIUM CHLORIDE 0.9 % IV SOLN
1250.0000 mg | Freq: Two times a day (BID) | INTRAVENOUS | Status: DC
  Administered 2011-12-30 – 2012-01-01 (×5): 1250 mg via INTRAVENOUS
  Filled 2011-12-30 (×6): qty 1250

## 2011-12-30 MED ORDER — ALPRAZOLAM 0.5 MG PO TABS: 0.5000 mg | ORAL_TABLET | Freq: Four times a day (QID) | ORAL | Status: DC | PRN

## 2011-12-30 MED ORDER — FENTANYL CITRATE 0.05 MG/ML IJ SOLN
INTRAMUSCULAR | Status: DC | PRN
  Administered 2011-12-30: 100 ug via INTRAVENOUS
  Administered 2011-12-30: 50 ug via INTRAVENOUS
  Administered 2011-12-30: 100 ug via INTRAVENOUS

## 2011-12-30 SURGICAL SUPPLY — 43 items
BANDAGE CONFORM 2  STR LF (GAUZE/BANDAGES/DRESSINGS) ×2 IMPLANT
BANDAGE ELASTIC 4 VELCRO ST LF (GAUZE/BANDAGES/DRESSINGS) ×2 IMPLANT
BANDAGE GAUZE ELAST BULKY 4 IN (GAUZE/BANDAGES/DRESSINGS) ×2 IMPLANT
CLOTH BEACON ORANGE TIMEOUT ST (SAFETY) ×2 IMPLANT
CORDS BIPOLAR (ELECTRODE) ×2 IMPLANT
CUFF TOURNIQUET SINGLE 18IN (TOURNIQUET CUFF) ×2 IMPLANT
CUFF TOURNIQUET SINGLE 24IN (TOURNIQUET CUFF) IMPLANT
CUFF TOURNIQUET SINGLE 34IN LL (TOURNIQUET CUFF) IMPLANT
CUFF TOURNIQUET SINGLE 44IN (TOURNIQUET CUFF) IMPLANT
DRSG ADAPTIC 3X8 NADH LF (GAUZE/BANDAGES/DRESSINGS) ×2 IMPLANT
ELECT REM PT RETURN 9FT ADLT (ELECTROSURGICAL) ×2
ELECTRODE REM PT RTRN 9FT ADLT (ELECTROSURGICAL) ×1 IMPLANT
GAUZE XEROFORM 1X8 LF (GAUZE/BANDAGES/DRESSINGS) ×2 IMPLANT
GAUZE XEROFORM 5X9 LF (GAUZE/BANDAGES/DRESSINGS) ×2 IMPLANT
GLOVE BIOGEL M STRL SZ7.5 (GLOVE) ×2 IMPLANT
GLOVE SS BIOGEL STRL SZ 8 (GLOVE) ×1 IMPLANT
GLOVE SUPERSENSE BIOGEL SZ 8 (GLOVE) ×1
GOWN PREVENTION PLUS XLARGE (GOWN DISPOSABLE) ×2 IMPLANT
GOWN STRL NON-REIN LRG LVL3 (GOWN DISPOSABLE) ×6 IMPLANT
GOWN STRL REIN XL XLG (GOWN DISPOSABLE) ×4 IMPLANT
HANDPIECE INTERPULSE COAX TIP (DISPOSABLE)
KIT BASIN OR (CUSTOM PROCEDURE TRAY) ×2 IMPLANT
KIT ROOM TURNOVER OR (KITS) ×2 IMPLANT
LOOP VESSEL MAXI BLUE (MISCELLANEOUS) ×2 IMPLANT
MANIFOLD NEPTUNE II (INSTRUMENTS) ×2 IMPLANT
NEEDLE HYPO 25GX1X1/2 BEV (NEEDLE) ×4 IMPLANT
NS IRRIG 1000ML POUR BTL (IV SOLUTION) ×2 IMPLANT
PACK ORTHO EXTREMITY (CUSTOM PROCEDURE TRAY) ×2 IMPLANT
PAD ARMBOARD 7.5X6 YLW CONV (MISCELLANEOUS) ×4 IMPLANT
PAD CAST 4YDX4 CTTN HI CHSV (CAST SUPPLIES) ×1 IMPLANT
PADDING CAST COTTON 4X4 STRL (CAST SUPPLIES) ×1
SET HNDPC FAN SPRY TIP SCT (DISPOSABLE) IMPLANT
SPLINT FIBERGLASS 4X15 (CAST SUPPLIES) ×2 IMPLANT
SPONGE GAUZE 4X4 12PLY (GAUZE/BANDAGES/DRESSINGS) ×2 IMPLANT
SPONGE LAP 18X18 X RAY DECT (DISPOSABLE) ×2 IMPLANT
SPONGE LAP 4X18 X RAY DECT (DISPOSABLE) ×2 IMPLANT
SYR CONTROL 10ML LL (SYRINGE) IMPLANT
TOWEL OR 17X24 6PK STRL BLUE (TOWEL DISPOSABLE) ×2 IMPLANT
TOWEL OR 17X26 10 PK STRL BLUE (TOWEL DISPOSABLE) ×2 IMPLANT
TUBE ANAEROBIC SPECIMEN COL (MISCELLANEOUS) ×4 IMPLANT
TUBE CONNECTING 12X1/4 (SUCTIONS) ×2 IMPLANT
WATER STERILE IRR 1000ML POUR (IV SOLUTION) ×2 IMPLANT
YANKAUER SUCT BULB TIP NO VENT (SUCTIONS) ×2 IMPLANT

## 2011-12-30 NOTE — Op Note (Signed)
See Dictation#[id number] Dominica Severin MD

## 2011-12-30 NOTE — Anesthesia Preprocedure Evaluation (Signed)
Anesthesia Evaluation  Patient identified by MRN, date of birth, ID band Patient awake    Reviewed: Allergy & Precautions, H&P , NPO status   Airway Mallampati: II      Dental   Pulmonary neg pulmonary ROS,          Cardiovascular hypertension, Pt. on medications     Neuro/Psych  Headaches, Anxiety    GI/Hepatic negative GI ROS, Neg liver ROS,   Endo/Other    Renal/GU negative Renal ROS     Musculoskeletal   Abdominal   Peds  Hematology   Anesthesia Other Findings   Reproductive/Obstetrics                           Anesthesia Physical Anesthesia Plan  ASA: III  Anesthesia Plan: General   Post-op Pain Management:    Induction: Intravenous  Airway Management Planned: Oral ETT  Additional Equipment:   Intra-op Plan:   Post-operative Plan: Extubation in OR  Informed Consent: I have reviewed the patients History and Physical, chart, labs and discussed the procedure including the risks, benefits and alternatives for the proposed anesthesia with the patient or authorized representative who has indicated his/her understanding and acceptance.   Dental advisory given  Plan Discussed with: Anesthesiologist, CRNA and Surgeon  Anesthesia Plan Comments:         Anesthesia Quick Evaluation

## 2011-12-30 NOTE — H&P (Signed)
  See Dictation #[id number] Dominica Severin MD

## 2011-12-30 NOTE — Transfer of Care (Signed)
Immediate Anesthesia Transfer of Care Note  Patient: [facility]  Procedure(s) Performed: Procedure(s) (LRB) with comments: IRRIGATION AND DEBRIDEMENT EXTREMITY (Right) - 1st finger  Patient Location: PACU  Anesthesia Type:General  Level of Consciousness: awake, alert  and oriented  Airway & Oxygen Therapy: Patient Spontanous Breathing  Post-op Assessment: Report given to PACU RN and Post -op Vital signs reviewed and stable  Post vital signs: Reviewed and stable  Complications: No apparent anesthesia complications

## 2011-12-30 NOTE — Progress Notes (Signed)
Subjective: Day of Surgery Procedure(s) (LRB): IRRIGATION AND DEBRIDEMENT EXTREMITY (Right) Patient awake this morning, pain well controlled. She is eating breakfast without difficulties. No current complaints. Patient states the IV site is uncomfortable(positional). Overall, she states the hand feels somewhat better.  Objective: Vital signs in last 24 hours: Temp:  [97.7 F (36.5 C)-99.1 F (37.3 C)] 97.8 F (36.6 C) (11/06 0322) Pulse Rate:  [70-80] 77  (11/06 0322) Resp:  [14-23] 18  (11/06 0322) BP: (110-129)/(68-84) 111/68 mmHg (11/06 0322) SpO2:  [93 %-100 %] 100 % (11/06 0322) Weight:  [139 kg (306 lb 7 oz)] 139 kg (306 lb 7 oz) (11/06 0245)  Intake/Output from previous day: 11/05 0701 - 11/06 0700 In: 600 [I.V.:600] Out: -  Intake/Output this shift:     Basename 12/29/11 2030  HGB 14.1    Basename 12/29/11 2030  WBC 9.2  RBC 4.75  HCT 41.9  PLT 359    Basename 12/29/11 2030  NA 139  K 4.6  CL 101  CO2 29  BUN 15  CREATININE 1.04  GLUCOSE 106*  CALCIUM 9.7   No results found for this basename: LABPT:2,INR:2 in the last 72 hours  Patient awake, alert and oriented, NAD Head atraumatic, normocephalic Chest with equal expansions respirations nonlabored RUE dressing clean and intact, no signs of ascending cellulitis Assessment/Plan: Day of Surgery Procedure(s) (LRB): IRRIGATION AND DEBRIDEMENT EXTREMITY (Right) Continue IV abx, await cultures and monitor wound conditions.   [name] L 12/30/2011, 12:57 PM

## 2011-12-30 NOTE — Anesthesia Postprocedure Evaluation (Signed)
  Anesthesia Post-op Note  Patient: Heather Delgado  Procedure(s) Performed: Procedure(s) (LRB) with comments: IRRIGATION AND DEBRIDEMENT EXTREMITY (Right) - 1st finger  Patient Location: PACU  Anesthesia Type:General  Level of Consciousness: awake  Airway and Oxygen Therapy: Patient Spontanous Breathing  Post-op Pain: mild  Post-op Assessment: Post-op Vital signs reviewed  Post-op Vital Signs: Reviewed  Complications: No apparent anesthesia complications

## 2011-12-30 NOTE — Progress Notes (Addendum)
ANTIBIOTIC CONSULT NOTE - INITIAL  Pharmacy Consult for Vancomycin and Zosyn Indication: Empiric post-op - infected finger  Allergies  Allergen Reactions  . Sulfa Antibiotics Anaphylaxis and Rash  . Triamterene Anaphylaxis and Rash  . Other Other (See Comments)    Medications that have thimersol in them cause the patient to develop blisters.     Patient Measurements: Height: 5\' 5"  (165.1 cm) Weight: 306 lb 7 oz (139 kg) IBW/kg (Calculated) : 57  Adjusted Body Weight: 82 kg  Vital Signs: Temp: 98.7 F (37.1 C) (11/06 0218) Temp src: Oral (11/05 2327) BP: 122/73 mmHg (11/06 0245) Pulse Rate: 76  (11/06 0245) Intake/Output from previous day: 11/05 0701 - 11/06 0700 In: 600 [I.V.:600] Out: -  Intake/Output from this shift: Total I/O In: 600 [I.V.:600] Out: -   Labs:  Basename 12/29/11 2030  WBC 9.2  HGB 14.1  PLT 359  LABCREA --  CREATININE 1.04   Estimated Creatinine Clearance: 91.7 ml/min (by C-G formula based on Cr of 1.04). No results found for this basename: VANCOTROUGH:2,VANCOPEAK:2,VANCORANDOM:2,GENTTROUGH:2,GENTPEAK:2,GENTRANDOM:2,TOBRATROUGH:2,TOBRAPEAK:2,TOBRARND:2,AMIKACINPEAK:2,AMIKACINTROU:2,AMIKACIN:2, in the last 72 hours   Microbiology: No results found for this or any previous visit (from the past 720 hour(s)).  Medical History: Past Medical History  Diagnosis Date  . Hypertension   . Reflux   . Diverticulitis   . Back pain     Medications:  Prescriptions prior to admission  Medication Sig Dispense Refill  . atenolol (TENORMIN) 25 MG tablet Take 25 mg by mouth every evening. For palpitations      . cephALEXin (KEFLEX) 500 MG capsule Take 500 mg by mouth 3 (three) times daily. For 7 days      . Cholecalciferol (VITAMIN D3) 2000 UNITS TABS Take 1 tablet by mouth daily.      . clobetasol ointment (TEMOVATE) 0.05 % Apply 1 application topically daily as needed. Likens Disease      . DULoxetine (CYMBALTA) 60 MG capsule Take 60 mg by mouth every  morning.       Marland Kitchen esomeprazole (NEXIUM) 40 MG capsule Take 40 mg by mouth at bedtime.      Marland Kitchen HYDROcodone-acetaminophen (VICODIN) 5-500 MG per tablet Take 1-2 tablets by mouth every 6 (six) hours as needed. For pain      . ibuprofen (ADVIL) 200 MG tablet Take 400 mg by mouth every 6 (six) hours as needed. For pain      . lisinopril (PRINIVIL,ZESTRIL) 20 MG tablet Take 20 mg by mouth at bedtime.      Marland Kitchen loratadine (CLARITIN) 10 MG tablet Take 10 mg by mouth at bedtime.      . Misc Natural Products (OSTEO BI-FLEX ADV TRIPLE ST) TABS Take 1 tablet by mouth daily.      . Multiple Vitamins-Minerals (THRIVE FOR LIFE WOMENS) TABS Take 1 tablet by mouth daily.      . Probiotic Product (PROBIOTIC FORMULA PO) Take 1 capsule by mouth at bedtime. INGREDIENTS: FOS (Fructooligosaccharides) 50 mg *  ConcenTrace Trace Mineral Complex  (from the [facility], 72 naturally occurring minerals, plus other minerals found in seawater) 12.5 mg *  Bifidobacterium longum 430 million viable organisms? *  Lactobacillus acidophilus 430 million viable organisms? *  Bifidobacterium bifidum 180 million viable organisms? *  Bifidobacterium breve 180 million viable organisms? *  Bifidobacterium lactis 180 million viable organisms? *  Lactobacillus brevis 180 million viable organisms? *  Lactobacillus bulgaricus 180 million viable organisms? *  Lactobacillus casei 180 million viable organisms? *  Lactobacillus helveticus 180 million  viable organisms? *  Lactobacillus plantarum 180 million viable organisms? *  Lactobacillus rhamnosus  180 million viable organisms? *  Lactobacillus salivarius  180 million viable organisms? *  Lactococcus lactis  180 million viable organisms? *  Streptococcus thermophilus  180 million viable organisms? *  Bifidobacterium infantis  90 million viable organisms? *   *Daily Value not established.  ?Colony-Forming Units (live bacteria) at time of manufacture.   Other ingredients:  Microcrystalline cellulose (plant fiber), hypromellose (vegetable capsule, gellan gum), alfalfa, may contain one or both of the following: magnesium stearate, silica.   Suggested Use: As a dietary supplement, take one veggie capsule per day with water or juice.      Marland Kitchen zolpidem (AMBIEN) 10 MG tablet Take 10 mg by mouth at bedtime as needed. Sleep       Assessment: 51 y.o. female presents with infected index finger. S/p surgery 11/5.  Received 1gm IV Vanco pre-op at 2115. To continue vancomycin post-op and add zosyn. Estimated CrCl 80 ml/min.  Goal of Therapy:  Vancomycin trough level 10-15 mcg/ml  Plan:  1. Vancomycin 1250mg  IV q12h. 2. Zosyn 3.375gm IV q8h. Each dose over 4 hours 3. F/u microbiological data, length of therapy planned for post-op antibiotic, consider trough if continues.  Christoper Fabian, PharmD, BCPS Clinical pharmacist, pager [phone] 12/30/2011,2:49 AM

## 2011-12-30 NOTE — Consult Note (Signed)
NAME:  Heather Delgado                  ACCOUNT NO.:  [id number]  MEDICAL RECORD NO.:  [id number]  LOCATION:  [id number]                        FACILITY:  [facility]  PHYSICIAN:  Heather Delgado, M.D.DATE OF BIRTH:  Jan 25, 1961  DATE OF CONSULTATION: DATE OF DISCHARGE:                                CONSULTATION   I had the pleasure to see Heather Delgado at 1:30 in the morning at [facility].  This patient is a 51 year old female who complains of pain in her right index finger.  She has an unusual presentation.  She states that 5 days ago, she began to having pain in her index finger. She thought it was a hangnail and felt that she got the hangnail infected due to a rash and small minor infection on her back according to report.  She states he had a pimple on her back.  Her daughters itched her back and rubbed it and she did herself.  She thinks that she introduce bacteria into her index finger due to this.  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  She last worked about a week ago.  She denies history of herpetic whitlow.  The patient has been on Keflex by Dr. Catalina Delgado in [place] since Friday.  In addition, she has been in the emergency room today and has had vancomycin.  I have reviewed her findings with the emergency room staff Dr. Effie Delgado.  He recommended that she be transferred to [facility] given the severity of her findings.  She is here with her daughter.  She notes no other unusual exposures. She states it is difficult to move the finger.  I have reviewed this at length and her findings.  Past medical history is reviewed.  Past surgical history is also reviewed.  She notes no obvious trauma to the finger.  She does not recall any problems when she was working as a Radiographer, therapeutic or other issues as they are germane to her predicament.  It should note that she does not have any obvious osteomyelitis or obvious fracture.  Her BMET is within normal limits.  Her white  blood cell count is 9.2.  Medicines are reviewed in her chart.  She is allergic to SULFA, TRIAMTERENE.  She has history of cholecystectomy, C-section, knee surgery.  Past surgical history of hypertension, reflux diverticulitis, and back pain.  She does not smoke or drink or use illicit drugs.  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  PHYSICAL EXAMINATION:  The patient has rubor from the PIP distally about the index finger, right hand.  She has vesicle/areas of abscess x3 about the central and radial aspects of the finger.  The discoloration is somewhat concerning in my opinion.  She does not have frying Kanavel's signs; however, she certainly has an infection in the tissues.  I have read this with her at length and the findings.  Once again labs were reviewed.  Chart and x-rays were reviewed.  X-rays are negative.  HEENT is within normal limits.  Chest is clear.  Abdomen is nontender.  She is morbidly obese.  She does have bruising over her back, but no evidence of  a frank abscess.  There is small eschar in the midportion, which was the localized area of abnormality.  I did not see any ominous looking signs or symptoms on her back and certainly, there is no evidence of necrotizing fasciitis.  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  RECOMMENDATIONS:  I have discussed with the patient her findings.  I would recommend I and D including viral cultures, aerobic and anaerobic cultures.  I would admit her for IV antibiotics, general observation, whirlpools beginning tomorrow and close observatory care.  We are going to monitor her closely.  I have discussed her that I would likely consider getting some help with her antibiotics in terms of our Infectious Disease Department.  I did not see any classic vesicles suggestive of obvious herpetic whitlow; however, dental hygienist one must consider this.  I have discussed these issues at length,  the do's and don'ts.  I have given all issues, we are going to proceed to the operative theater to try and decompress the areas in terms of helping her and getting some degree of look with exploration.  She understands this and will proceed accordingly.  All questions have been encouraged and answered.     [facility] Delgado, M.D.     [facility]  D:  12/30/2011  T:  12/30/2011  Job:  [id number]  cc:   [facility], M.D.

## 2011-12-30 NOTE — Op Note (Signed)
NAME:  Heather Delgado, CONATY                  ACCOUNT NO.:  [id number]  MEDICAL RECORD NO.:  [id number]  LOCATION:  5N11C                        FACILITY:  [facility]  PHYSICIAN:  Dionne Ano. Abhishek Levesque, M.D.DATE OF BIRTH:  [birth year]  DATE OF PROCEDURE: DATE OF DISCHARGE:                              OPERATIVE REPORT   PREOPERATIVE DIAGNOSIS:  Infection, index finger right hand with multiple areas of abscess and poor wound conditions.  POSTOPERATIVE DIAGNOSIS:  Infection, index finger right hand with multiple areas of abscess and poor wound conditions.  PROCEDURE: 1. Irrigation and debridement of deep abscess x2, right index finger 2. I and D with flexor tenosynovectomy and flexor tendon sheath     decompression, right index finger.  This was a flexor sheath     tenolysis, tenosynovectomy, and I and D.  SURGEON:  Dionne Ano. Heather Pea, MD  ASSISTANT:  None.  COMPLICATIONS:  None.  ANESTHESIA:  General.  TOURNIQUET TIME:  Less than 10 minutes.  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  Her consult and H and P note is well recorded in her chart.  She has a 5-day history of worsening pain and problems despite p.o. antibiotics.  She has rather unusual looking infection and we have recommended I and D.  She was seen at [facility] as well as by Dr. Catalina Pizza somewhat recently. The folks at [facility] asked her to come down so that we would take over her care.  She arrived at 1:30 in the morning and we are certainly on-call in here to see her.  OPERATION IN DETAIL:  The patient was seen by myself and anesthesia, taken to the operative suite.  Time-out was called, pre and postop check list complete.  Finger was prepped and draped in usual sterile fashion with Betadine scrub and paint.  Following this, I performed I and D of skin, subcutaneous tissue about a deep abscess x2 separate locations in the finger, 1 dorsal radially, the other midline.  Aerobic, anaerobic, and viral cultures  were sent.  Given the fact that she is Armed forces operational officer, I wanted to rule out any type of herpetic whitlow infection, etc.  Once cultures were taken, I then performed I and D with copious amounts of saline.  Once this was done, I then opened the flexor tendon sheath and performed I and D of the flexor sheath with tenolysis, tenosynovectomy.  The neurovascular bundles were carefully protected. Copious amounts of saline were placed through and through.  There was devitalized fatty necrosis and evidence of infection in my opinion.  I performed placement of 2 vessel loop drains and then further irrigation. I left the wounds wide open to allow for drainage.  The patient tolerated this well.  She was placed in a sterile dressing, Adaptic, Xeroform gauze, Kerlix, and a volar plaster splint.  We will monitor condition closely.  Plan for wet-to-dry dressing changes and other measures.  I would give her guarded prognosis as this is a bit of an unusual infection with late presentation.  Nevertheless, we can do everything we can to eradicate the infection in an aggressive fashion. Given her issues, we are going to  look now only for viral etiologies.  I cannot rule out a bacterial infection as result of viral infection with manipulation as she had a large abnormality on her back that she kept scratching which she feels ultimately may have caused the index finger predicament.  Nevertheless, we will await cultures, monitor condition closely, and move forward in aggressive fashion in hopes to give her better finger and hand that she can use again.     Dionne Ano. Heather Delgado, M.D.     [facility]  D:  12/30/2011  T:  12/30/2011  Job:  [id number]

## 2011-12-30 NOTE — Anesthesia Procedure Notes (Signed)
Procedure Name: Intubation Date/Time: 12/30/2011 1:23 AM Performed by: Molli Hazard Pre-anesthesia Checklist: Patient identified, Emergency Drugs available, Suction available and Patient being monitored Patient Re-evaluated:Patient Re-evaluated prior to inductionOxygen Delivery Method: Circle system utilized Preoxygenation: Pre-oxygenation with 100% oxygen Intubation Type: IV induction, Rapid sequence and Cricoid Pressure applied Laryngoscope Size: Miller and 2 Grade View: Grade I Tube type: Oral Tube size: 7.0 mm Number of attempts: 1 Airway Equipment and Method: Stylet Placement Confirmation: ETT inserted through vocal cords under direct vision,  positive ETCO2 and breath sounds checked- equal and bilateral Secured at: 24 cm Tube secured with: Tape Dental Injury: Teeth and Oropharynx as per pre-operative assessment

## 2011-12-30 NOTE — Preoperative (Signed)
Beta Blockers   Reason not to administer Beta Blockers:Not Applicable 

## 2011-12-31 ENCOUNTER — Encounter (HOSPITAL_COMMUNITY): Payer: Self-pay | Admitting: Orthopedic Surgery

## 2011-12-31 LAB — CBC WITH DIFFERENTIAL/PLATELET
Eosinophils Absolute: 0.3 10*3/uL (ref 0.0–0.7)
Hemoglobin: 12.9 g/dL (ref 12.0–15.0)
Lymphocytes Relative: 34 % (ref 12–46)
Lymphs Abs: 3 10*3/uL (ref 0.7–4.0)
MCH: 29 pg (ref 26.0–34.0)
MCV: 87.6 fL (ref 78.0–100.0)
Monocytes Relative: 11 % (ref 3–12)
Neutrophils Relative %: 51 % (ref 43–77)
Platelets: 297 10*3/uL (ref 150–400)
RBC: 4.45 MIL/uL (ref 3.87–5.11)
WBC: 8.9 10*3/uL (ref 4.0–10.5)

## 2011-12-31 LAB — BASIC METABOLIC PANEL
BUN: 11 mg/dL (ref 6–23)
CO2: 26 mEq/L (ref 19–32)
Chloride: 104 mEq/L (ref 96–112)
Glucose, Bld: 91 mg/dL (ref 70–99)
Potassium: 3.9 mEq/L (ref 3.5–5.1)
Sodium: 140 mEq/L (ref 135–145)

## 2011-12-31 NOTE — Progress Notes (Signed)
Subjective: 1 Day Post-Op Procedure(s) (LRB): IRRIGATION AND DEBRIDEMENT EXTREMITY (Right) Patient reports pain as mild  Objective: Vital signs in last 24 hours: Temp:  [97.9 F (36.6 C)-98.3 F (36.8 C)] 98.3 F (36.8 C) (11/07 1416) Pulse Rate:  [65-79] 78  (11/07 1745) Resp:  [18] 18  (11/07 1416) BP: (113-120)/(56-81) 120/60 mmHg (11/07 1745) SpO2:  [94 %-100 %] 100 % (11/07 1416)  Intake/Output from previous day: 11/06 0701 - 11/07 0700 In: 1590 [P.O.:680; I.V.:910] Out: -  Intake/Output this shift:     Basename 12/31/11 0624 12/29/11 2030  HGB 12.9 14.1    Basename 12/31/11 0624 12/29/11 2030  WBC 8.9 9.2  RBC 4.45 4.75  HCT 39.0 41.9  PLT 297 359    Basename 12/31/11 0624 12/29/11 2030  NA 140 139  K 3.9 4.6  CL 104 101  CO2 26 29  BUN 11 15  CREATININE 0.65 1.04  GLUCOSE 91 106*  CALCIUM 9.1 9.7   No results found for this basename: LABPT:2,INR:2 in the last 72 hours  Doing well   I&D at bedside performed .Marland KitchenThe patient is alert and oriented in no acute distress the patient complains of pain in the affected upper extremity. The patient is noted to have a normal HEENT exam. Lung fields show equal chest expansion and no shortness of breath abdomen exam is nontender without distention. Lower extremity examination does not show any fracture dislocation or blood clot symptoms. Pelvis is stable neck and back are stable and nontender  Assessment/Plan: 1 Day Post-Op Procedure(s) (LRB): IRRIGATION AND DEBRIDEMENT EXTREMITY (Right) Continue IV ABX and obs Await culture results Jannelly Bergren III,Stanton Kissoon M 12/31/2011, 8:43 PM

## 2012-01-01 LAB — CULTURE, ROUTINE-ABSCESS

## 2012-01-01 LAB — WOUND CULTURE
Culture: NO GROWTH
Gram Stain: NONE SEEN
Special Requests: NORMAL

## 2012-01-01 MED ORDER — AMOXICILLIN 500 MG PO CAPS: 500.0000 mg | ORAL_CAPSULE | Freq: Three times a day (TID) | ORAL | Status: DC

## 2012-01-01 MED ORDER — HYDROCODONE-ACETAMINOPHEN 5-500 MG PO TABS: 1.0000 | ORAL_TABLET | Freq: Four times a day (QID) | ORAL | Status: DC | PRN

## 2012-01-01 NOTE — Op Note (Signed)
NAME:  Heather Delgado, Heather Delgado                  ACCOUNT NO.:  [id number]  MEDICAL RECORD NO.:  [id number]  LOCATION:  [id number]                        FACILITY:  [facility]  PHYSICIAN:  Dionne Ano. Talayah Picardi, M.D.DATE OF BIRTH:  Mar 18, 1960  DATE OF PROCEDURE:  12/31/2011 DATE OF DISCHARGE:  01/01/2012                              OPERATIVE REPORT   [name] underwent I and D of skin and subcutaneous tissue.  This was an I and D performed with scalpel, scissor, and forceps as well as curette. She tolerated this procedure well and there were no complicating features.  This was a index finger irrigation and debridement.  We copiously lavaged with multiple liters of saline and following this I performed a wet-to-dry dressing change and continued close observatory care for her and monitored her culture results.  She will continue IV antibiotics in the mean time.     Dionne Ano. [name], M.D.     [facility]  D:  01/01/2012  T:  01/01/2012  Job:  [id number]

## 2012-01-01 NOTE — Discharge Summary (Signed)
  Please see dictated discharge summary  # [phone]

## 2012-01-01 NOTE — Progress Notes (Signed)
ANTIBIOTIC CONSULT NOTE - FOLLOW UP  Pharmacy Consult for Vancomycin/Zosyn Indication: Empric coverage of infected finger  Allergies  Allergen Reactions  . Sulfa Antibiotics Anaphylaxis and Rash  . Triamterene Anaphylaxis and Rash  . Other Other (See Comments)    Medications that have thimersol in them cause the patient to develop blisters.     Patient Measurements: Height: 5\' 5"  (165.1 cm) Weight: 306 lb 7 oz (139 kg) IBW/kg (Calculated) : 57   Vital Signs: Temp: 97.5 F (36.4 C) (11/08 0500) BP: 124/58 mmHg (11/08 0500) Pulse Rate: 74  (11/08 0500) Intake/Output from previous day: 11/07 0701 - 11/08 0700 In: 2747.5 [P.O.:960; I.V.:1487.5; IV Piggyback:300] Out: -  Intake/Output from this shift:    Labs:  [facility] 12/31/11 0624 12/29/11 2030  WBC 8.9 9.2  HGB 12.9 14.1  PLT 297 359  LABCREA -- --  CREATININE 0.65 1.04   Estimated Creatinine Clearance: 119.3 ml/min (by C-G formula based on Cr of 0.65). No results found for this basename: VANCOTROUGH:2,VANCOPEAK:2,VANCORANDOM:2,GENTTROUGH:2,GENTPEAK:2,GENTRANDOM:2,TOBRATROUGH:2,TOBRAPEAK:2,TOBRARND:2,AMIKACINPEAK:2,AMIKACINTROU:2,AMIKACIN:2, in the last 72 hours   Microbiology: Recent Results (from the past 720 hour(s))  WOUND CULTURE     Status: Normal   Collection Time   12/29/11 11:21 PM      Component Value Range Status Comment   Specimen Description HAND   Final    Special Requests Normal   Final    Gram Stain     Final    Value: NO WBC SEEN     RARE SQUAMOUS EPITHELIAL CELLS PRESENT     NO ORGANISMS SEEN   Culture NO GROWTH 2 DAYS   Final    Report Status 01/01/2012 FINAL   Final   VIRAL CULTURE VIRC     Status: Normal (Preliminary result)   Collection Time   12/30/11  2:28 AM      Component Value Range Status Comment   Specimen Description ABSCESS RIGHT FIRST FINGER   Final    Special Requests NONE   Final    Culture Culture has been initiated.   Final    Report Status PENDING   Incomplete     CULTURE, ROUTINE-ABSCESS     Status: Normal   Collection Time   12/30/11  2:30 AM      Component Value Range Status Comment   Specimen Description ABSCESS   Final    Special Requests     Final    Value: RIGHT FIRST FINGER PATIENT ON FOLLOWING KEFLEX AND VANC   Gram Stain     Final    Value: RARE WBC PRESENT, PREDOMINANTLY PMN     NO SQUAMOUS EPITHELIAL CELLS SEEN     NO ORGANISMS SEEN   Culture RARE GROUP A STREP (S.PYOGENES) ISOLATED   Final    Report Status 01/01/2012 FINAL   Final   ANAEROBIC CULTURE     Status: Normal (Preliminary result)   Collection Time   12/30/11  2:30 AM      Component Value Range Status Comment   Specimen Description ABSCESS   Final    Special Requests     Final    Value: RIGHT FIRST FINGER PATIENT ON FOLLOWING KEFLEX AND VANC   Gram Stain PENDING   Incomplete    Culture     Final    Value: NO ANAEROBES ISOLATED; CULTURE IN PROGRESS FOR 5 DAYS   Report Status PENDING   Incomplete     Anti-infectives     Start     Dose/Rate Route Frequency Ordered Stop  12/30/11 0400   vancomycin (VANCOCIN) 1,250 mg in sodium chloride 0.9 % 250 mL IVPB        1,250 mg 166.7 mL/hr over 90 Minutes Intravenous Every 12 hours 12/30/11 0256     12/30/11 0400  piperacillin-tazobactam (ZOSYN) IVPB 3.375 g       3.375 g 12.5 mL/hr over 240 Minutes Intravenous 3 times per day 12/30/11 0323     12/29/11 2030   vancomycin (VANCOCIN) IVPB 1000 mg/200 mL premix        1,000 mg 200 mL/hr over 60 Minutes Intravenous  Once 12/29/11 2029 12/29/11 2250          Assessment: Miss. Heather Delgado is a 51 year old female with an infected finger on day 4 vancomycin and day 3 zosyn. Abscess culture resulted 11/8 with rare group A strep. Patient is afebrile and wbc count within normal limits. Renal function remains stable.   Goal of Therapy:  Vancomycin trough level 10-15 mcg/ml  Plan:  Continue vancomycin 1250mg  IV q12h Continue zosyn 3.375g IV q8h (extended interval infusion) F/u change  to po antibiotics   Thank you,  Brett Fairy, PharmD 01/01/2012 10:10 AM

## 2012-01-02 NOTE — Discharge Summary (Signed)
NAME:  Heather Delgado, Heather Delgado                  ACCOUNT NO.:  [id number]  MEDICAL RECORD NO.:  [id number]  LOCATION:  5N11C                        FACILITY:  [facility]  PHYSICIAN:  Dionne Ano. Miesha Bachmann, M.D.DATE OF BIRTH:  [birth year]  DATE OF ADMISSION:  12/29/2011 DATE OF DISCHARGE:  01/01/2012                              DISCHARGE SUMMARY   OPERATIVE DIAGNOSIS/DISCHARGE DIAGNOSIS:  Status post infected right index finger, status post irrigation and debridement.  HOSPITAL COURSE:  The patient is a pleasant female, who was referred by [facility] in regards to upper extremity predicament.  We have counseled her in regards to the predicament and on the day of visit, promptly took her to Surgery for a surgical I and D of the finger.  She had had an infectious process, which was worsening.  Despite initial measure she worsened to the point that she was transferred for upper extremity care.  We saw her emergently, took her to the operative arena and performed irrigation and debridement as the operative note reflects. Following the debridement, the patient underwent a very careful and cautious postoperative approach with I and D's at bedside on postop day #1 and #2.  The patient's culture was ultimately grew out group A strep. Her wound continue to improved despite some hyperemia to the distal tip. She had no ascending cellulitis or flexor tenosynovitis that was excessive.  At the time of discharge, she had grown out group A strep.  We placed her on a penicillin sensitive antibiotic and taught her home dressing changes with wet-to-dry dressings, to be performed daily.  Dressing supplies were dispensed and she understood quite clearly the upper extremity predicament and our plan.  She was discharged home on appropriate pain medicine and antibiotics as discussed and as to records will reflect.  She will return to see [place] Monday at 11 a.m., continue dressing changes daily and notify should any  problems occur.  Do's and don'ts have been discussed and all questions have been encouraged and answered.  Should any problems arise, she will notify us immediately. She will continue a regular diet, and continue her home medicines as instructed.  At the time of discharge, she was awake, alert and oriented, and had stable vital signs.  HEENT was within in normal limits.  Chest was clear.  Vital signs were checked by nursing staff and are reflected in the notes.  Abdomen was protuberant, nontender, and she had no other complicating features.  She had excellently looking wound.  I should note that prior to discharging her home, we did perform irrigation and debridement of the finger.  This was an irrigation and debridement of skin and subcutaneous tissue utilizing scissor, curette and scalpel.  The wound was stable and they continue to look improved. This was copious I and D without difficulty, which she tolerated well. Following this, she was instructed on the wet-to-dry dressing changes at home.    Dionne Ano. [name], M.D.    [facility]  D:  01/01/2012  T:  01/02/2012  Job:  [id number]

## 2012-01-04 LAB — VIRAL CULTURE VIRC

## 2012-01-04 LAB — ANAEROBIC CULTURE

## 2012-03-13 ENCOUNTER — Encounter (HOSPITAL_COMMUNITY): Payer: Self-pay | Admitting: Emergency Medicine

## 2012-03-13 ENCOUNTER — Emergency Department (HOSPITAL_COMMUNITY): Payer: BC Managed Care – PPO

## 2012-03-13 ENCOUNTER — Emergency Department (HOSPITAL_COMMUNITY)
Admission: EM | Admit: 2012-03-13 | Discharge: 2012-03-13 | Disposition: A | Discharge status: Home / Self Care | Payer: BC Managed Care – PPO | Attending: Emergency Medicine | Admitting: Emergency Medicine

## 2012-03-13 DIAGNOSIS — Z791 Long term (current) use of non-steroidal anti-inflammatories (NSAID): Secondary | ICD-10-CM | POA: Insufficient documentation

## 2012-03-13 DIAGNOSIS — Z794 Long term (current) use of insulin: Secondary | ICD-10-CM | POA: Insufficient documentation

## 2012-03-13 DIAGNOSIS — Z79899 Other long term (current) drug therapy: Secondary | ICD-10-CM | POA: Insufficient documentation

## 2012-03-13 DIAGNOSIS — R05 Cough: Secondary | ICD-10-CM

## 2012-03-13 DIAGNOSIS — M159 Polyosteoarthritis, unspecified: Secondary | ICD-10-CM | POA: Insufficient documentation

## 2012-03-13 DIAGNOSIS — I10 Essential (primary) hypertension: Secondary | ICD-10-CM | POA: Insufficient documentation

## 2012-03-13 DIAGNOSIS — J45909 Unspecified asthma, uncomplicated: Secondary | ICD-10-CM | POA: Insufficient documentation

## 2012-03-13 DIAGNOSIS — M129 Arthropathy, unspecified: Secondary | ICD-10-CM | POA: Insufficient documentation

## 2012-03-13 DIAGNOSIS — R6883 Chills (without fever): Secondary | ICD-10-CM | POA: Insufficient documentation

## 2012-03-13 DIAGNOSIS — K219 Gastro-esophageal reflux disease without esophagitis: Secondary | ICD-10-CM | POA: Insufficient documentation

## 2012-03-13 DIAGNOSIS — Z8719 Personal history of other diseases of the digestive system: Secondary | ICD-10-CM | POA: Insufficient documentation

## 2012-03-13 DIAGNOSIS — R059 Cough, unspecified: Secondary | ICD-10-CM | POA: Insufficient documentation

## 2012-03-13 DIAGNOSIS — E669 Obesity, unspecified: Secondary | ICD-10-CM | POA: Insufficient documentation

## 2012-03-13 DIAGNOSIS — R071 Chest pain on breathing: Secondary | ICD-10-CM | POA: Insufficient documentation

## 2012-03-13 DIAGNOSIS — R0989 Other specified symptoms and signs involving the circulatory and respiratory systems: Secondary | ICD-10-CM | POA: Insufficient documentation

## 2012-03-13 MED ORDER — GUAIFENESIN-CODEINE 100-10 MG/5ML PO SYRP: 10.0000 mL | ORAL_SOLUTION | Freq: Three times a day (TID) | ORAL | Status: AC | PRN

## 2012-03-13 MED ORDER — AZITHROMYCIN 250 MG PO TABS: ORAL_TABLET | ORAL | Status: DC

## 2012-03-13 MED ORDER — ALBUTEROL SULFATE HFA 108 (90 BASE) MCG/ACT IN AERS
1.0000 | INHALATION_SPRAY | RESPIRATORY_TRACT | Status: DC
  Administered 2012-03-13: 1 via RESPIRATORY_TRACT
  Filled 2012-03-13: qty 6.7

## 2012-03-13 MED ORDER — AZITHROMYCIN 250 MG PO TABS
500.0000 mg | ORAL_TABLET | Freq: Once | ORAL | Status: AC
  Administered 2012-03-13: 500 mg via ORAL
  Filled 2012-03-13: qty 2

## 2012-03-13 NOTE — ED Provider Notes (Signed)
History     CSN: [id number]  Arrival date & time 03/13/12  1041   First MD Initiated Contact with Patient 03/13/12 1228      Chief Complaint  Patient presents with  . Cough    (Consider location/radiation/quality/duration/timing/severity/associated sxs/prior treatment) Patient is a 52 y.o. female presenting with cough. The history is provided by the patient.  Cough This is a chronic problem. The current episode started more than 1 week ago. The problem occurs every few minutes. The problem has not changed since onset.The cough is productive of purulent sputum. There has been no fever. Associated symptoms include chest pain, chills and wheezing. Pertinent negatives include no sweats, no ear congestion, no ear pain, no headaches, no rhinorrhea, no sore throat, no myalgias, no shortness of breath and no eye redness. Associated symptoms comments: Chest congestion. She has tried cough syrup for the symptoms. The treatment provided no relief. She is not a smoker. Her past medical history is significant for asthma. Her past medical history does not include bronchitis, pneumonia or COPD.    Past Medical History  Diagnosis Date  . Hypertension   . Reflux   . Diverticulitis   . Back pain   . Arthritis   . Spondylolysis   . DJD (degenerative joint disease)   . Asthma     Past Surgical History  Procedure Date  . Cholecystectomy   . Cesarean section   . Knee surgery   . Infected skin debridement 12/29/2011    RIGHT INDEX   . I&d extremity 12/30/2011    Procedure: IRRIGATION AND DEBRIDEMENT EXTREMITY;  Surgeon: Dominica Severin, MD;  Location: [facility] OR;  Service: Orthopedics;  Laterality: Right;  1st finger    History reviewed. No pertinent family history.  History  Substance Use Topics  . Smoking status: Never Smoker   . Smokeless tobacco: Never Used  . Alcohol Use: No    OB History    Grav Para Term Preterm Abortions TAB SAB Ect Mult Living                  Review of Systems    Constitutional: Positive for chills. Negative for fever, activity change and appetite change.  HENT: Positive for congestion. Negative for ear pain, sore throat, facial swelling, rhinorrhea, trouble swallowing, neck pain and neck stiffness.   Eyes: Negative for redness and visual disturbance.  Respiratory: Positive for cough and wheezing. Negative for chest tightness, shortness of breath and stridor.   Cardiovascular: Positive for chest pain.  Gastrointestinal: Negative for nausea, vomiting and abdominal pain.  Genitourinary: Negative for dysuria and flank pain.  Musculoskeletal: Negative for myalgias and back pain.  Skin: Negative.  Negative for color change and wound.  Neurological: Negative for dizziness, weakness, numbness and headaches.  Hematological: Negative for adenopathy.  Psychiatric/Behavioral: Negative for confusion.  All other systems reviewed and are negative.    Allergies  Sulfa antibiotics; Triamterene; and Other  Home Medications   Current Outpatient Rx  Name  Route  Sig  Dispense  Refill  . AMOXICILLIN 500 MG PO CAPS   Oral   Take 1 capsule (500 mg total) by mouth 3 (three) times daily.   30 capsule   0   . ATENOLOL 25 MG PO TABS   Oral   Take 25 mg by mouth every evening. For palpitations         . VITAMIN D3 2000 UNITS PO TABS   Oral   Take 1 tablet by mouth daily.         Marland Kitchen  CLOBETASOL PROPIONATE 0.05 % EX OINT   Topical   Apply 1 application topically daily as needed. Likens Disease         . DULOXETINE HCL 60 MG PO CPEP   Oral   Take 60 mg by mouth every morning.          Marland Kitchen ESOMEPRAZOLE MAGNESIUM 40 MG PO CPDR   Oral   Take 40 mg by mouth at bedtime.         Marland Kitchen HYDROCODONE-ACETAMINOPHEN 5-500 MG PO TABS   Oral   Take 1-2 tablets by mouth every 6 (six) hours as needed. For pain         . HYDROCODONE-ACETAMINOPHEN 5-500 MG PO TABS   Oral   Take 1 tablet by mouth every 6 (six) hours as needed for pain.   30 tablet   0   .  IBUPROFEN 200 MG PO TABS   Oral   Take 400 mg by mouth every 6 (six) hours as needed. For pain         . LISINOPRIL 20 MG PO TABS   Oral   Take 20 mg by mouth at bedtime.         Marland Kitchen LORATADINE 10 MG PO TABS   Oral   Take 10 mg by mouth at bedtime.         . OSTEO BI-FLEX ADV TRIPLE ST PO TABS   Oral   Take 1 tablet by mouth daily.         . THRIVE FOR LIFE WOMENS PO TABS   Oral   Take 1 tablet by mouth daily.         Marland Kitchen PROBIOTIC FORMULA PO   Oral   Take 1 capsule by mouth at bedtime. INGREDIENTS: FOS (Fructooligosaccharides) 50 mg *  ConcenTrace Trace Mineral Complex  (from the [facility], 72 naturally occurring minerals, plus other minerals found in seawater) 12.5 mg *  Bifidobacterium longum 430 million viable organisms? *  Lactobacillus acidophilus 430 million viable organisms? *  Bifidobacterium bifidum 180 million viable organisms? *  Bifidobacterium breve 180 million viable organisms? *  Bifidobacterium lactis 180 million viable organisms? *  Lactobacillus brevis 180 million viable organisms? *  Lactobacillus bulgaricus 180 million viable organisms? *  Lactobacillus casei 180 million viable organisms? *  Lactobacillus helveticus 180 million viable organisms? *  Lactobacillus plantarum 180 million viable organisms? *  Lactobacillus rhamnosus  180 million viable organisms? *  Lactobacillus salivarius  180 million viable organisms? *  Lactococcus lactis  180 million viable organisms? *  Streptococcus thermophilus  180 million viable organisms? *  Bifidobacterium infantis  90 million viable organisms? *   *Daily Value not established.  ?Colony-Forming Units (live bacteria) at time of manufacture.   Other ingredients: Microcrystalline cellulose (plant fiber), hypromellose (vegetable capsule, gellan gum), alfalfa, may contain one or both of the following: magnesium stearate, silica.   Suggested Use: As a dietary supplement, take one veggie capsule per  day with water or juice.         Marland Kitchen ZOLPIDEM TARTRATE 10 MG PO TABS   Oral   Take 10 mg by mouth at bedtime as needed. Sleep           BP 133/80  Pulse 73  Temp 98.7 F (37.1 C) (Oral)  SpO2 97%  Physical Exam  Nursing note and vitals reviewed. Constitutional: She is oriented to person, place, and time. She appears well-developed and well-nourished. No distress.  obese  HENT:  Head: Normocephalic and atraumatic.  Right Ear: Tympanic membrane and ear canal normal.  Left Ear: Tympanic membrane and ear canal normal.  Mouth/Throat: Uvula is midline, oropharynx is clear and moist and mucous membranes are normal. No oropharyngeal exudate.  Eyes: EOM are normal. Pupils are equal, round, and reactive to light.  Neck: Normal range of motion. Neck supple.  Cardiovascular: Normal rate, regular rhythm, normal heart sounds and intact distal pulses.   No murmur heard. Pulmonary/Chest: Effort normal. No respiratory distress. She has no wheezes. She has no rales. She exhibits no tenderness.       Coarse lungs sounds bilaterally.  No active wheezing or rales  Abdominal: Soft. She exhibits no distension. There is no tenderness.  Musculoskeletal: She exhibits no edema.  Lymphadenopathy:    She has no cervical adenopathy.  Neurological: She is alert and oriented to person, place, and time. She exhibits normal muscle tone. Coordination normal.  Skin: Skin is warm and dry.    ED Course  Procedures (including critical care time)  Labs Reviewed - No data to display Dg Chest 2 View  03/13/2012  *RADIOLOGY REPORT*  Clinical Data: Cough  CHEST - 2 VIEW  Comparison: 09/07/2011  Findings: The heart, mediastinal, and hilar contours are normal. The pulmonary vascularity is normal.  The lungs are normally expanded and clear.  Negative for pleural effusion or pneumothorax. No acute osseous abnormality.  IMPRESSION: No acute cardiopulmonary disease.   Original Report Authenticated By: Britta Mccreedy,  M.D.         MDM     Coarse lungs in the right lung base.  No wheezing or rales.  Pt is well appearing.  Vitals stable, no hypoxia, tachycardia or tachypnea.  Doubt PE.  Will treat with albuterol and z-pack , pt prefers not to have oral prednisone.  Agrees to close f/u with her PMD or to return here if needed     Ashayla Subia L. [place], [place] 03/14/12 2026

## 2012-03-13 NOTE — ED Notes (Signed)
Cough since November. Hearing crackles in lung when laying on right side. Nad. No resp distress. Yellow phlegm in morning then turns white.pain to chest only with coughing. No pain now

## 2012-03-13 NOTE — ED Notes (Signed)
Patient with no complaints at this time. Respirations even and unlabored. Skin warm/dry. Discharge instructions reviewed with patient at this time. Patient given opportunity to voice concerns/ask questions. Patient discharged at this time and left Emergency Department with steady gait.   

## 2012-03-16 NOTE — ED Provider Notes (Signed)
Medical screening examination/treatment/procedure(s) were performed by non-physician practitioner and as supervising physician I was immediately available for consultation/collaboration.   Joya Gaskins, MD 03/16/12 [phone]

## 2012-03-21 ENCOUNTER — Other Ambulatory Visit: Payer: Self-pay | Admitting: Obstetrics & Gynecology

## 2012-03-21 DIAGNOSIS — N6009 Solitary cyst of unspecified breast: Secondary | ICD-10-CM

## 2012-04-21 ENCOUNTER — Ambulatory Visit
Admission: RE | Admit: 2012-04-21 | Discharge: 2012-04-21 | Disposition: A | Discharge status: Home / Self Care | Payer: BC Managed Care – PPO | Source: Ambulatory Visit | Attending: Obstetrics & Gynecology | Admitting: Obstetrics & Gynecology

## 2012-04-21 ENCOUNTER — Other Ambulatory Visit: Payer: Self-pay | Admitting: Obstetrics & Gynecology

## 2012-04-21 DIAGNOSIS — N6009 Solitary cyst of unspecified breast: Secondary | ICD-10-CM

## 2013-02-15 ENCOUNTER — Emergency Department (HOSPITAL_COMMUNITY)
Admission: EM | Admit: 2013-02-15 | Discharge: 2013-02-15 | Disposition: A | Discharge status: Home / Self Care | Payer: BC Managed Care – PPO | Attending: Emergency Medicine | Admitting: Emergency Medicine

## 2013-02-15 ENCOUNTER — Encounter (HOSPITAL_COMMUNITY): Payer: Self-pay | Admitting: Emergency Medicine

## 2013-02-15 ENCOUNTER — Emergency Department (HOSPITAL_COMMUNITY): Payer: BC Managed Care – PPO

## 2013-02-15 DIAGNOSIS — K5792 Diverticulitis of intestine, part unspecified, without perforation or abscess without bleeding: Secondary | ICD-10-CM

## 2013-02-15 DIAGNOSIS — Z79899 Other long term (current) drug therapy: Secondary | ICD-10-CM | POA: Insufficient documentation

## 2013-02-15 DIAGNOSIS — K5730 Diverticulosis of large intestine without perforation or abscess without bleeding: Secondary | ICD-10-CM | POA: Insufficient documentation

## 2013-02-15 DIAGNOSIS — K219 Gastro-esophageal reflux disease without esophagitis: Secondary | ICD-10-CM | POA: Insufficient documentation

## 2013-02-15 DIAGNOSIS — M1990 Unspecified osteoarthritis, unspecified site: Secondary | ICD-10-CM | POA: Insufficient documentation

## 2013-02-15 DIAGNOSIS — J45909 Unspecified asthma, uncomplicated: Secondary | ICD-10-CM | POA: Insufficient documentation

## 2013-02-15 DIAGNOSIS — I10 Essential (primary) hypertension: Secondary | ICD-10-CM | POA: Insufficient documentation

## 2013-02-15 LAB — URINE MICROSCOPIC-ADD ON

## 2013-02-15 LAB — CBC WITH DIFFERENTIAL/PLATELET
Basophils Relative: 0 % (ref 0–1)
Eosinophils Absolute: 0.2 10*3/uL (ref 0.0–0.7)
Eosinophils Relative: 3 % (ref 0–5)
HCT: 42.8 % (ref 36.0–46.0)
Hemoglobin: 14.1 g/dL (ref 12.0–15.0)
Lymphs Abs: 1.9 10*3/uL (ref 0.7–4.0)
MCH: 28.8 pg (ref 26.0–34.0)
MCHC: 32.9 g/dL (ref 30.0–36.0)
MCV: 87.5 fL (ref 78.0–100.0)
Monocytes Absolute: 1.1 10*3/uL — ABNORMAL HIGH (ref 0.1–1.0)
Monocytes Relative: 15 % — ABNORMAL HIGH (ref 3–12)
Neutrophils Relative %: 57 % (ref 43–77)
RBC: 4.89 MIL/uL (ref 3.87–5.11)

## 2013-02-15 LAB — URINALYSIS, ROUTINE W REFLEX MICROSCOPIC
Ketones, ur: NEGATIVE mg/dL
Leukocytes, UA: NEGATIVE
Nitrite: NEGATIVE
Protein, ur: NEGATIVE mg/dL
pH: 6 (ref 5.0–8.0)

## 2013-02-15 LAB — COMPREHENSIVE METABOLIC PANEL
Albumin: 3.4 g/dL — ABNORMAL LOW (ref 3.5–5.2)
Alkaline Phosphatase: 58 U/L (ref 39–117)
BUN: 12 mg/dL (ref 6–23)
Creatinine, Ser: 0.74 mg/dL (ref 0.50–1.10)
GFR calc Af Amer: >90 mL/min (ref >90)
Glucose, Bld: 100 mg/dL — ABNORMAL HIGH (ref 70–99)
Potassium: 4.6 mEq/L (ref 3.5–5.1)
Total Protein: 6.7 g/dL (ref 6.0–8.3)

## 2013-02-15 MED ORDER — PANTOPRAZOLE SODIUM 40 MG IV SOLR
40.0000 mg | Freq: Once | INTRAVENOUS | Status: DC
  Filled 2013-02-15: qty 40

## 2013-02-15 MED ORDER — HYDROMORPHONE HCL PF 1 MG/ML IJ SOLN
1.0000 mg | Freq: Once | INTRAMUSCULAR | Status: AC
  Administered 2013-02-15: 1 mg via INTRAVENOUS
  Filled 2013-02-15: qty 1

## 2013-02-15 MED ORDER — LIDOCAINE HCL (PF) 1 % IJ SOLN
INTRAMUSCULAR | Status: AC
  Filled 2013-02-15: qty 5

## 2013-02-15 MED ORDER — PANTOPRAZOLE SODIUM 40 MG PO TBEC
40.0000 mg | DELAYED_RELEASE_TABLET | Freq: Once | ORAL | Status: AC
  Administered 2013-02-15: 40 mg via ORAL
  Filled 2013-02-15: qty 1

## 2013-02-15 MED ORDER — METRONIDAZOLE 500 MG PO TABS
500.0000 mg | ORAL_TABLET | Freq: Once | ORAL | Status: AC
  Administered 2013-02-15: 500 mg via ORAL
  Filled 2013-02-15: qty 1

## 2013-02-15 MED ORDER — METRONIDAZOLE 500 MG PO TABS: 500.0000 mg | ORAL_TABLET | Freq: Four times a day (QID) | ORAL | Status: DC

## 2013-02-15 MED ORDER — ONDANSETRON 4 MG PO TBDP: ORAL_TABLET | ORAL | Status: DC

## 2013-02-15 MED ORDER — OXYCODONE-ACETAMINOPHEN 5-325 MG PO TABS: 2.0000 | ORAL_TABLET | Freq: Four times a day (QID) | ORAL | Status: DC | PRN

## 2013-02-15 MED ORDER — CEFTRIAXONE SODIUM 1 G IJ SOLR
1.0000 g | Freq: Once | INTRAMUSCULAR | Status: AC
  Administered 2013-02-15: 1 g via INTRAMUSCULAR
  Filled 2013-02-15: qty 10

## 2013-02-15 MED ORDER — CIPROFLOXACIN HCL 250 MG PO TABS
500.0000 mg | ORAL_TABLET | Freq: Once | ORAL | Status: AC
  Administered 2013-02-15: 500 mg via ORAL
  Filled 2013-02-15: qty 2

## 2013-02-15 MED ORDER — ONDANSETRON HCL 4 MG/2ML IJ SOLN
4.0000 mg | Freq: Once | INTRAMUSCULAR | Status: DC
  Filled 2013-02-15: qty 2

## 2013-02-15 MED ORDER — ONDANSETRON 8 MG PO TBDP
8.0000 mg | ORAL_TABLET | Freq: Once | ORAL | Status: AC
  Administered 2013-02-15: 8 mg via ORAL
  Filled 2013-02-15: qty 1

## 2013-02-15 MED ORDER — CIPROFLOXACIN HCL 500 MG PO TABS: 500.0000 mg | ORAL_TABLET | Freq: Two times a day (BID) | ORAL | Status: DC

## 2013-02-15 NOTE — ED Notes (Signed)
Pt reports has taken 2 doses of cipro and took tylenol hydrocodone this am around 0100.

## 2013-02-15 NOTE — ED Provider Notes (Signed)
CSN: [id number]     Arrival date & time 02/15/13  3086 History  This chart was scribed for Benny Lennert, MD by Bennett Scrape, ED Scribe. This patient was seen in room APA12/APA12 and the patient's care was started at 7:26 AM.   Chief Complaint  Patient presents with  . Abdominal Pain    Patient is a 52 y.o. female presenting with abdominal pain. The history is provided by the patient. No language interpreter was used.  Abdominal Pain Pain location:  LLQ Pain radiates to:  Does not radiate Onset quality:  Gradual Duration:  2 days Timing:  Constant Progression:  Worsening Associated symptoms: nausea   Associated symptoms: no chest pain, no cough, no diarrhea, no fatigue, no hematuria and no vomiting     HPI Comments: RUBEN PYKA is a 52 y.o. female who presents to the Emergency Department complaining of 2 days of LLQ pain with associated nausea. She states that the symptoms started as a bloating feeling but localized yesterday to the LLQ and has been gradually worsening since. She reports 2 prior episodes of the same with one episode that was diagnosed as diverticulitis and one that was attributed to a duodenal ulcer. She denies being on any NSAIDs currently which was the cause of the ulcer. She denies any emesis or diarrhea.  She has a h/o asthma and reports a recent Pulmonology appointment. She states that she was prescribed steroids but denies taking any yet.   PCP is Dr. Dwana Melena  Past Medical History  Diagnosis Date  . Hypertension   . Reflux   . Diverticulitis   . Back pain   . Arthritis   . Spondylolysis   . DJD (degenerative joint disease)   . Asthma    Past Surgical History  Procedure Laterality Date  . Cholecystectomy    . Cesarean section    . Knee surgery    . Infected skin debridement  12/29/2011    RIGHT INDEX   . I&d extremity  12/30/2011    Procedure: IRRIGATION AND DEBRIDEMENT EXTREMITY;  Surgeon: Dominica Severin, MD;  Location: [facility] OR;  Service:  Orthopedics;  Laterality: Right;  1st finger   No family history on file. History  Substance Use Topics  . Smoking status: Never Smoker   . Smokeless tobacco: Never Used  . Alcohol Use: No   No OB history provided.   Review of Systems  Constitutional: Negative for appetite change and fatigue.  HENT: Negative for congestion, ear discharge and sinus pressure.   Eyes: Negative for discharge.  Respiratory: Negative for cough.   Cardiovascular: Negative for chest pain.  Gastrointestinal: Positive for nausea and abdominal pain. Negative for vomiting and diarrhea.  Genitourinary: Negative for frequency and hematuria.  Musculoskeletal: Negative for back pain.  Skin: Negative for rash.  Neurological: Negative for seizures and headaches.  Psychiatric/Behavioral: Negative for hallucinations.    Allergies  Sulfa antibiotics; Triamterene; and Other  Home Medications   Current Outpatient Rx  Name  Route  Sig  Dispense  Refill  . amoxicillin (AMOXIL) 500 MG capsule   Oral   Take 1 capsule (500 mg total) by mouth 3 (three) times daily.   30 capsule   0   . atenolol (TENORMIN) 25 MG tablet   Oral   Take 25 mg by mouth every evening. For palpitations         . azithromycin (ZITHROMAX Z-PAK) 250 MG tablet      Take two tablets on day  one, then one tab qd days 2-5   6 tablet   0   . Cholecalciferol (VITAMIN D3) 2000 UNITS TABS   Oral   Take 1 tablet by mouth daily.         . clobetasol ointment (TEMOVATE) 0.05 %   Topical   Apply 1 application topically daily as needed. Likens Disease         . DULoxetine (CYMBALTA) 60 MG capsule   Oral   Take 60 mg by mouth every morning.          Marland Kitchen esomeprazole (NEXIUM) 40 MG capsule   Oral   Take 40 mg by mouth at bedtime.         Marland Kitchen HYDROcodone-acetaminophen (VICODIN) 5-500 MG per tablet   Oral   Take 1-2 tablets by mouth every 6 (six) hours as needed. For pain         . HYDROcodone-acetaminophen (VICODIN) 5-500 MG per  tablet   Oral   Take 1 tablet by mouth every 6 (six) hours as needed for pain.   30 tablet   0   . ibuprofen (ADVIL) 200 MG tablet   Oral   Take 400 mg by mouth every 6 (six) hours as needed. For pain         . lisinopril (PRINIVIL,ZESTRIL) 20 MG tablet   Oral   Take 20 mg by mouth at bedtime.         Marland Kitchen loratadine (CLARITIN) 10 MG tablet   Oral   Take 10 mg by mouth at bedtime.         . Misc Natural Products (OSTEO BI-FLEX ADV TRIPLE ST) TABS   Oral   Take 1 tablet by mouth daily.         . Multiple Vitamins-Minerals (THRIVE FOR LIFE WOMENS) TABS   Oral   Take 1 tablet by mouth daily.         . Probiotic Product (PROBIOTIC FORMULA PO)   Oral   Take 1 capsule by mouth at bedtime. INGREDIENTS: FOS (Fructooligosaccharides) 50 mg *  ConcenTrace Trace Mineral Complex  (from the [facility], 72 naturally occurring minerals, plus other minerals found in seawater) 12.5 mg *  Bifidobacterium longum 430 million viable organisms? *  Lactobacillus acidophilus 430 million viable organisms? *  Bifidobacterium bifidum 180 million viable organisms? *  Bifidobacterium breve 180 million viable organisms? *  Bifidobacterium lactis 180 million viable organisms? *  Lactobacillus brevis 180 million viable organisms? *  Lactobacillus bulgaricus 180 million viable organisms? *  Lactobacillus casei 180 million viable organisms? *  Lactobacillus helveticus 180 million viable organisms? *  Lactobacillus plantarum 180 million viable organisms? *  Lactobacillus rhamnosus  180 million viable organisms? *  Lactobacillus salivarius  180 million viable organisms? *  Lactococcus lactis  180 million viable organisms? *  Streptococcus thermophilus  180 million viable organisms? *  Bifidobacterium infantis  90 million viable organisms? *   *Daily Value not established.  ?Colony-Forming Units (live bacteria) at time of manufacture.   Other ingredients: Microcrystalline cellulose (plant  fiber), hypromellose (vegetable capsule, gellan gum), alfalfa, may contain one or both of the following: magnesium stearate, silica.   Suggested Use: As a dietary supplement, take one veggie capsule per day with water or juice.         Marland Kitchen zolpidem (AMBIEN) 10 MG tablet   Oral   Take 10 mg by mouth at bedtime as needed. Sleep  Triage Vitals: BP 136/76  Pulse 84  Temp(Src) 98.2 F (36.8 C) (Oral)  Resp 18  Ht 5\' 5"  (1.651 m)  Wt 329 lb (149.233 kg)  BMI 54.75 kg/m2  SpO2 96%  Physical Exam  Nursing note and vitals reviewed. Constitutional: She is oriented to person, place, and time. No distress.  Morbidly obese  HENT:  Head: Normocephalic and atraumatic.  Eyes: Conjunctivae and EOM are normal. No scleral icterus.  Neck: Neck supple. No thyromegaly present.  Cardiovascular: Normal rate and regular rhythm.  Exam reveals no gallop and no friction rub.   No murmur heard. Pulmonary/Chest: Effort normal and breath sounds normal. No stridor. She has no wheezes. She has no rales. She exhibits no tenderness.  Abdominal: She exhibits no distension. There is tenderness (Moderate LLQ ). There is no rebound.  Musculoskeletal: Normal range of motion. She exhibits no edema.  Lymphadenopathy:    She has no cervical adenopathy.  Neurological: She is alert and oriented to person, place, and time. She exhibits normal muscle tone. Coordination normal.  Skin: Skin is warm and dry. No rash noted. No erythema.  Psychiatric: She has a normal mood and affect. Her behavior is normal.    ED Course  Procedures (including critical care time)  Medications  pantoprazole (PROTONIX) injection 40 mg (not administered)  ondansetron (ZOFRAN) injection 4 mg (not administered)  HYDROmorphone (DILAUDID) injection 1 mg (not administered)    DIAGNOSTIC STUDIES: Oxygen Saturation is 96% on RA, adequate by my interpretation.    COORDINATION OF CARE: 7:28 AM-Discussed treatment plan which includes  pain medications and blood work with pt at bedside and pt agreed to plan.   Labs Review Labs Reviewed  CBC WITH DIFFERENTIAL - Abnormal; Notable for the following:    Monocytes Relative 15 (*)    Monocytes Absolute 1.1 (*)    All other components within normal limits  COMPREHENSIVE METABOLIC PANEL - Abnormal; Notable for the following:    Glucose, Bld 100 (*)    Albumin 3.4 (*)    All other components within normal limits  URINALYSIS, ROUTINE W REFLEX MICROSCOPIC - Abnormal; Notable for the following:    Specific Gravity, Urine <1.005 (*)    Hgb urine dipstick SMALL (*)    All other components within normal limits  URINE MICROSCOPIC-ADD ON   Imaging Review No results found.  EKG Interpretation   None       MDM  Diverticulitis,   Pt non toxic,  Pt wanted out pt tx try first.  Pt to follow up with pcp  Benny Lennert, MD 02/15/13 1041

## 2013-02-15 NOTE — ED Notes (Signed)
Pt c/o pain in LLQ x 2 days.  Reports nausea, no vomiting or diarrhea.  Reports history of diverticulitis and duodenal ulcer.

## 2013-08-23 ENCOUNTER — Other Ambulatory Visit (HOSPITAL_COMMUNITY)
Admission: RE | Admit: 2013-08-23 | Discharge: 2013-08-23 | Disposition: A | Discharge status: Home / Self Care | Payer: BC Managed Care – PPO | Source: Ambulatory Visit | Attending: Obstetrics & Gynecology | Admitting: Obstetrics & Gynecology

## 2013-08-23 ENCOUNTER — Encounter: Payer: Self-pay | Admitting: Obstetrics & Gynecology

## 2013-08-23 ENCOUNTER — Ambulatory Visit (INDEPENDENT_AMBULATORY_CARE_PROVIDER_SITE_OTHER): Payer: BC Managed Care – PPO | Admitting: Obstetrics & Gynecology

## 2013-08-23 ENCOUNTER — Ambulatory Visit (INDEPENDENT_AMBULATORY_CARE_PROVIDER_SITE_OTHER): Payer: BC Managed Care – PPO

## 2013-08-23 ENCOUNTER — Other Ambulatory Visit: Payer: Self-pay | Admitting: Obstetrics & Gynecology

## 2013-08-23 VITALS — BP 130/90 | Ht 65.0 in | Wt 321.0 lb

## 2013-08-23 DIAGNOSIS — N949 Unspecified condition associated with female genital organs and menstrual cycle: Secondary | ICD-10-CM

## 2013-08-23 DIAGNOSIS — Z1151 Encounter for screening for human papillomavirus (HPV): Secondary | ICD-10-CM | POA: Insufficient documentation

## 2013-08-23 DIAGNOSIS — R102 Pelvic and perineal pain: Secondary | ICD-10-CM

## 2013-08-23 DIAGNOSIS — Z124 Encounter for screening for malignant neoplasm of cervix: Secondary | ICD-10-CM

## 2013-08-23 DIAGNOSIS — R1032 Left lower quadrant pain: Secondary | ICD-10-CM

## 2013-08-23 DIAGNOSIS — Z01419 Encounter for gynecological examination (general) (routine) without abnormal findings: Secondary | ICD-10-CM | POA: Insufficient documentation

## 2013-08-23 NOTE — Progress Notes (Signed)
Patient ID: Heather Delgado, female   DOB: 1960-11-19, 53 y.o.   MRN: [id number] Chief Complaint  Patient presents with  . Pain in lt ovary area    subside now. need pap.    HPI Pt presents complaining of sharp intermittent left lower quadrant pain which she experienced for the first time yesterday States never had before No associated symptoms Wrapped around above leg crease Has history of back pain and problems, no change in that   ROS No burning with urination, frequency or urgency No nausea, vomiting or diarrhea or constipation Nor fever chills or other constitutional symptoms   Blood pressure 130/90, height 5\' 5"  (1.651 m), weight 321 lb (145.605 kg).  EXAM Abdomen:  soft, protuberant, tenderness moderate, without guarding, without rebound - LLQ, no masses palpated Vulva:  normal appearing vulva with no masses, tenderness or lesions Vagina:  normal mucosa, no discharge, no  Lesions Cervix:  without lesions Pap obtained Uterus:  Normal size shape and contour  Adnexa:  normal bimanual exam and no masses non tender Rectal:  normal tone no masses and Hemoccult negative  US Transvaginal Non-ob  08/23/2013   GYNECOLOGIC SONOGRAM   Heather Delgado is a 53 y.o.  for a pelvic sonogram for LLQ pain x 24 hours.  Uterus                    7.1 x 4.6 x 3.5 cm,   Endometrium          3.5 mm, symmetrical, (small amount of fluid noted  within the cervical cavity)  Right ovary             2.6 x 1.9 x 1.4 cm,   Left ovary                2.4 x 1.9 x 1.6 cm,   No free fluid or adnexal masses noted within the pelvis  Technician Comments:  Anteverted uterus, Endom-3.63mm, bilateral adnexa/ovaries appear WNL, no  free fluid or adnexal masses noted within the pelvis   Heather Delgado 08/23/2013 11:47 AM  Clinical Impression and recommendations:  I have reviewed the sonogram results above, combined with the patient's  current clinical course, below are my impressions and any appropriate  recommendations for  management based on the sonographic findings.  Normal Pelvic sonogram, normal anatomy  Heather Delgado 08/23/2013 11:56 AM      Assessment/Plan: LLQ pain: does not appear to be gynecologic, most likely neurogenic vs musculoskeletal, has been diagnosed with fibromyalgia Pap done will send letter

## 2013-08-28 LAB — CYTOLOGY - PAP

## 2014-08-06 ENCOUNTER — Other Ambulatory Visit: Payer: Self-pay

## 2014-08-06 DIAGNOSIS — Z1231 Encounter for screening mammogram for malignant neoplasm of breast: Secondary | ICD-10-CM

## 2014-08-17 ENCOUNTER — Ambulatory Visit
Admission: RE | Admit: 2014-08-17 | Discharge: 2014-08-17 | Disposition: A | Discharge status: Home / Self Care | Payer: BLUE CROSS/BLUE SHIELD | Source: Ambulatory Visit

## 2014-08-17 DIAGNOSIS — Z1231 Encounter for screening mammogram for malignant neoplasm of breast: Secondary | ICD-10-CM

## 2014-10-03 ENCOUNTER — Emergency Department (HOSPITAL_COMMUNITY): Payer: BLUE CROSS/BLUE SHIELD

## 2014-10-03 ENCOUNTER — Emergency Department (HOSPITAL_COMMUNITY)
Admission: EM | Admit: 2014-10-03 | Discharge: 2014-10-03 | Disposition: A | Discharge status: Home / Self Care | Payer: BLUE CROSS/BLUE SHIELD | Attending: Emergency Medicine | Admitting: Emergency Medicine

## 2014-10-03 ENCOUNTER — Encounter (HOSPITAL_COMMUNITY): Payer: Self-pay

## 2014-10-03 DIAGNOSIS — K219 Gastro-esophageal reflux disease without esophagitis: Secondary | ICD-10-CM | POA: Diagnosis not present

## 2014-10-03 DIAGNOSIS — Z79899 Other long term (current) drug therapy: Secondary | ICD-10-CM | POA: Diagnosis not present

## 2014-10-03 DIAGNOSIS — Z91040 Latex allergy status: Secondary | ICD-10-CM | POA: Diagnosis not present

## 2014-10-03 DIAGNOSIS — K2970 Gastritis, unspecified, without bleeding: Secondary | ICD-10-CM | POA: Diagnosis not present

## 2014-10-03 DIAGNOSIS — I10 Essential (primary) hypertension: Secondary | ICD-10-CM | POA: Diagnosis not present

## 2014-10-03 DIAGNOSIS — Z7951 Long term (current) use of inhaled steroids: Secondary | ICD-10-CM | POA: Diagnosis not present

## 2014-10-03 DIAGNOSIS — N200 Calculus of kidney: Secondary | ICD-10-CM | POA: Diagnosis not present

## 2014-10-03 DIAGNOSIS — R319 Hematuria, unspecified: Secondary | ICD-10-CM

## 2014-10-03 DIAGNOSIS — J45909 Unspecified asthma, uncomplicated: Secondary | ICD-10-CM | POA: Diagnosis not present

## 2014-10-03 DIAGNOSIS — M1990 Unspecified osteoarthritis, unspecified site: Secondary | ICD-10-CM | POA: Diagnosis not present

## 2014-10-03 DIAGNOSIS — R1110 Vomiting, unspecified: Secondary | ICD-10-CM | POA: Diagnosis present

## 2014-10-03 LAB — COMPREHENSIVE METABOLIC PANEL
ALT: 38 U/L (ref 14–54)
ANION GAP: 11 (ref 5–15)
AST: 38 U/L (ref 15–41)
Albumin: 3.7 g/dL (ref 3.5–5.0)
Alkaline Phosphatase: 59 U/L (ref 38–126)
BILIRUBIN TOTAL: 0.6 mg/dL (ref 0.3–1.2)
BUN: 13 mg/dL (ref 6–20)
CHLORIDE: 102 mmol/L (ref 101–111)
CO2: 25 mmol/L (ref 22–32)
CREATININE: 0.61 mg/dL (ref 0.44–1.00)
Calcium: 8.4 mg/dL — ABNORMAL LOW (ref 8.9–10.3)
Glucose, Bld: 96 mg/dL (ref 65–99)
Potassium: 3.5 mmol/L (ref 3.5–5.1)
Sodium: 138 mmol/L (ref 135–145)
Total Protein: 6.9 g/dL (ref 6.5–8.1)

## 2014-10-03 LAB — CBC WITH DIFFERENTIAL/PLATELET
BASOS ABS: 0 10*3/uL (ref 0.0–0.1)
BASOS PCT: 0 % (ref 0–1)
EOS ABS: 0 10*3/uL (ref 0.0–0.7)
Eosinophils Relative: 1 % (ref 0–5)
HCT: 44.4 % (ref 36.0–46.0)
Hemoglobin: 15 g/dL (ref 12.0–15.0)
LYMPHS ABS: 1.2 10*3/uL (ref 0.7–4.0)
Lymphocytes Relative: 21 % (ref 12–46)
MCH: 29.8 pg (ref 26.0–34.0)
MCHC: 33.8 g/dL (ref 30.0–36.0)
MCV: 88.1 fL (ref 78.0–100.0)
Monocytes Absolute: 0.7 10*3/uL (ref 0.1–1.0)
Monocytes Relative: 13 % — ABNORMAL HIGH (ref 3–12)
NEUTROS PCT: 65 % (ref 43–77)
Neutro Abs: 3.7 10*3/uL (ref 1.7–7.7)
PLATELETS: 242 10*3/uL (ref 150–400)
RBC: 5.04 MIL/uL (ref 3.87–5.11)
RDW: 13.5 % (ref 11.5–15.5)
WBC: 5.6 10*3/uL (ref 4.0–10.5)

## 2014-10-03 LAB — URINALYSIS, ROUTINE W REFLEX MICROSCOPIC
BILIRUBIN URINE: NEGATIVE
Glucose, UA: NEGATIVE mg/dL
Leukocytes, UA: NEGATIVE
NITRITE: NEGATIVE
PH: 6 (ref 5.0–8.0)
Protein, ur: NEGATIVE mg/dL
Specific Gravity, Urine: 1.02 (ref 1.005–1.030)
Urobilinogen, UA: 0.2 mg/dL (ref 0.0–1.0)

## 2014-10-03 LAB — LIPASE, BLOOD: LIPASE: 14 U/L — AB (ref 22–51)

## 2014-10-03 LAB — URINE MICROSCOPIC-ADD ON

## 2014-10-03 MED ORDER — MORPHINE SULFATE 4 MG/ML IJ SOLN
4.0000 mg | Freq: Once | INTRAMUSCULAR | Status: AC
  Administered 2014-10-03: 4 mg via INTRAVENOUS
  Filled 2014-10-03: qty 1

## 2014-10-03 MED ORDER — SODIUM CHLORIDE 0.9 % IJ SOLN
INTRAMUSCULAR | Status: AC
  Filled 2014-10-03: qty 60

## 2014-10-03 MED ORDER — SUCRALFATE 1 GM/10ML PO SUSP: 1.0000 g | Freq: Three times a day (TID) | ORAL | Status: DC

## 2014-10-03 MED ORDER — ONDANSETRON 4 MG PO TBDP: 4.0000 mg | ORAL_TABLET | Freq: Three times a day (TID) | ORAL | Status: DC | PRN

## 2014-10-03 MED ORDER — SODIUM CHLORIDE 0.9 % IV BOLUS (SEPSIS)
500.0000 mL | Freq: Once | INTRAVENOUS | Status: AC
  Administered 2014-10-03: 500 mL via INTRAVENOUS

## 2014-10-03 MED ORDER — IOHEXOL 300 MG/ML  SOLN
125.0000 mL | Freq: Once | INTRAMUSCULAR | Status: AC | PRN
  Administered 2014-10-03: 125 mL via INTRAVENOUS

## 2014-10-03 MED ORDER — IOHEXOL 300 MG/ML  SOLN
25.0000 mL | Freq: Once | INTRAMUSCULAR | Status: AC | PRN
  Administered 2014-10-03: 25 mL via ORAL

## 2014-10-03 MED ORDER — ONDANSETRON HCL 4 MG/2ML IJ SOLN
4.0000 mg | Freq: Once | INTRAMUSCULAR | Status: AC
  Administered 2014-10-03: 4 mg via INTRAVENOUS
  Filled 2014-10-03: qty 2

## 2014-10-03 NOTE — Discharge Instructions (Signed)

## 2014-10-03 NOTE — ED Provider Notes (Signed)
CSN: [id number]     Arrival date & time 10/03/14  1043 History   First MD Initiated Contact with Patient 10/03/14 1142     Chief Complaint  Patient presents with  . Emesis     (Consider location/radiation/quality/duration/timing/severity/associated sxs/prior Treatment) HPI Comments: Pt comes in with c/o upper abdominal pain that started yesterday. Pt states that she had one episode of vomiting.she had similar episode a couple of weeks ago and Dr. Nevada Crane did an hpylori an checked her liver enzymes and everything was good. She was sent home on carafate which seemed to help a little but she is out. She has also developed fever. No diarrhea or dysuria. She doesn't have her gallbladder  The history is provided by the patient. No language interpreter was used.    Past Medical History  Diagnosis Date  . Hypertension   . Reflux   . Diverticulitis   . Back pain   . Arthritis   . Spondylolysis   . DJD (degenerative joint disease)   . Asthma    Past Surgical History  Procedure Laterality Date  . Cholecystectomy    . Cesarean section    . Knee surgery    . Infected skin debridement  12/29/2011    RIGHT INDEX   . I&d extremity  12/30/2011    Procedure: IRRIGATION AND DEBRIDEMENT EXTREMITY;  Surgeon: Roseanne Kaufman, MD;  Location: [place];  Service: Orthopedics;  Laterality: Right;  1st finger  . Carpal tunnel release     Family History  Problem Relation Age of Onset  . Stroke Maternal Grandmother   . Stroke Paternal Grandmother    Social History  Substance Use Topics  . Smoking status: Never Smoker   . Smokeless tobacco: Never Used  . Alcohol Use: No     Comment: occ   OB History    No data available     Review of Systems  All other systems reviewed and are negative.     Allergies  Sulfa antibiotics; Triamterene; Other; and Latex  Home Medications   Prior to Admission medications   Medication Sig Start Date End Date Taking? Authorizing Provider  albuterol (PROVENTIL  HFA;VENTOLIN HFA) 108 (90 BASE) MCG/ACT inhaler Inhale 2 puffs into the lungs every 6 (six) hours as needed for wheezing or shortness of breath.    Historical Provider, MD  atenolol (TENORMIN) 25 MG tablet Take 25 mg by mouth every evening. For palpitations    Historical Provider, MD  budesonide (PULMICORT) 0.5 MG/2ML nebulizer solution Take 0.5 mg by nebulization daily.    Historical Provider, MD  budesonide-formoterol (SYMBICORT) 160-4.5 MCG/ACT inhaler Inhale 2 puffs into the lungs 2 (two) times daily.    Historical Provider, MD  cetirizine (ZYRTEC) 10 MG tablet Take 10 mg by mouth daily.    Historical Provider, MD  ciprofloxacin (CIPRO) 500 MG tablet Take 1 tablet (500 mg total) by mouth 2 (two) times daily. One po bid x 7 days 02/15/13   Milton Ferguson, MD  clobetasol ointment (TEMOVATE) 8.41 % Apply 1 application topically daily as needed. Likens Disease    Historical Provider, MD  DULoxetine (CYMBALTA) 60 MG capsule Take 60 mg by mouth every morning.     Historical Provider, MD  esomeprazole (NEXIUM) 40 MG capsule Take 40 mg by mouth every morning.     Historical Provider, MD  furosemide (LASIX) 40 MG tablet Take 40 mg by mouth daily as needed for edema.    Historical Provider, MD  HYDROcodone-acetaminophen (NORCO/VICODIN) 5-325 MG  per tablet Take 1 tablet by mouth every 6 (six) hours as needed for moderate pain.    Historical Provider, MD  lisinopril (PRINIVIL,ZESTRIL) 20 MG tablet Take 20 mg by mouth at bedtime.    Historical Provider, MD  metroNIDAZOLE (FLAGYL) 500 MG tablet Take 1 tablet (500 mg total) by mouth 4 (four) times daily. One po bid x 7 days 02/15/13   Milton Ferguson, MD  Misc Natural Products (OSTEO BI-FLEX ADV TRIPLE ST) TABS Take 1 tablet by mouth 2 (two) times daily.     Historical Provider, MD  mometasone (NASONEX) 50 MCG/ACT nasal spray Place 2 sprays into the nose daily as needed (Allergies).    Historical Provider, MD  montelukast (SINGULAIR) 10 MG tablet Take 10 mg by  mouth at bedtime.    Historical Provider, MD  Multiple Vitamin (MULTIVITAMIN WITH MINERALS) TABS tablet Take 1 tablet by mouth daily.    Historical Provider, MD  Omega-3 Fatty Acids (OMEGA 3 PO) Take 1 capsule by mouth at bedtime.    Historical Provider, MD  ondansetron (ZOFRAN ODT) 4 MG disintegrating tablet 4mg  ODT q6 hours prn nausea/vomit 02/15/13   Milton Ferguson, MD  oxyCODONE-acetaminophen (PERCOCET) 5-325 MG per tablet Take 2 tablets by mouth every 6 (six) hours as needed. 02/15/13   Milton Ferguson, MD  Probiotic Product (PROBIOTIC DAILY PO) Take 1 tablet by mouth at bedtime.    Historical Provider, MD  Simethicone 180 MG CAPS Take 1-2 capsules by mouth daily as needed (Bloating).    Historical Provider, MD  Turmeric 500 MG CAPS Take 1 capsule by mouth 2 (two) times daily.    Historical Provider, MD  zolpidem (AMBIEN) 10 MG tablet Take 10 mg by mouth at bedtime as needed. Sleep    Historical Provider, MD   BP 101/78 mmHg  Pulse 91  Temp(Src) 98.8 F (37.1 C) (Oral)  Resp 19  Ht 5\' 5"  (1.651 m)  Wt 312 lb (141.522 kg)  BMI 51.92 kg/m2  SpO2 98% Physical Exam  Constitutional: She is oriented to person, place, and time. She appears well-developed and well-nourished.  HENT:  Head: Normocephalic and atraumatic.  Cardiovascular: Normal rate and regular rhythm.   Pulmonary/Chest: Effort normal and breath sounds normal.  Abdominal: Soft. Bowel sounds are normal. There is tenderness in the right upper quadrant, epigastric area and left upper quadrant.  Musculoskeletal: Normal range of motion.  Neurological: She is alert and oriented to person, place, and time.  Skin: Skin is warm and dry.  Psychiatric: She has a normal mood and affect.  Nursing note and vitals reviewed.   ED Course  Procedures (including critical care time) Labs Review Labs Reviewed  CBC WITH DIFFERENTIAL/PLATELET - Abnormal; Notable for the following:    Monocytes Relative 13 (*)    All other components within  normal limits  COMPREHENSIVE METABOLIC PANEL - Abnormal; Notable for the following:    Calcium 8.4 (*)    All other components within normal limits  URINALYSIS, ROUTINE W REFLEX MICROSCOPIC (NOT AT [facility]) - Abnormal; Notable for the following:    APPearance HAZY (*)    Hgb urine dipstick MODERATE (*)    Ketones, ur TRACE (*)    All other components within normal limits  LIPASE, BLOOD - Abnormal; Notable for the following:    Lipase 14 (*)    All other components within normal limits  URINE MICROSCOPIC-ADD ON - Abnormal; Notable for the following:    Squamous Epithelial / LPF MANY (*)  Bacteria, UA MANY (*)    All other components within normal limits    Imaging Review Ct Abdomen Pelvis W Contrast  10/03/2014   CLINICAL DATA:  Abdominal pain from June, worsening nausea and pain yesterday  EXAM: CT ABDOMEN AND PELVIS WITH CONTRAST  TECHNIQUE: Multidetector CT imaging of the abdomen and pelvis was performed using the standard protocol following bolus administration of intravenous contrast.  CONTRAST:  56mL OMNIPAQUE IOHEXOL 300 MG/ML SOLN, 151mL OMNIPAQUE IOHEXOL 300 MG/ML SOLN  COMPARISON:  02/15/2013  FINDINGS: There are streaky artifacts from patient's large body habitus. The lung bases are unremarkable.  Fatty infiltration of the liver. Status postcholecystectomy. No focal hepatic mass. There is no gastric outlet obstruction. No thickening of gastric wall.  The pancreas, spleen and adrenal glands are unremarkable.  Kidneys are symmetrical in size and enhancement. No hydronephrosis or hydroureter. Nonobstructive calculus in lower pole of the left kidney measures 3 mm.  Delayed renal images shows bilateral renal symmetrical excretion. 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  Mild atherosclerotic calcifications of distal abdominal aorta. No aortic aneurysm. Moderate colonic stool noted in right colon transverse colon and descending colon.  No small bowel obstruction. No ascites or free air. No  adenopathy. There is normal appendix. Terminal ileum is unremarkable. Left colon diverticula are noted. Multiple sigmoid colon diverticula. No evidence of acute diverticulitis. Unenhanced uterus is normal in size. Small uterine fibroid again noted. No adnexal mass. The urinary bladder is unremarkable.  IMPRESSION: 1. There is mild fatty infiltration of the liver. Status postcholecystectomy. 2. Left nonobstructive nephrolithiasis. No hydronephrosis or hydroureter. Bilateral duplicated ureter. 3. Distal colonic diverticula are noted. No evidence of acute diverticulitis. 4. Normal appendix.  No pericecal inflammation. 5. No small bowel obstruction.   Electronically Signed   By: Lahoma Crocker M.D.   On: 10/03/2014 13:08     EKG Interpretation None      MDM   Final diagnoses:  Hematuria  Kidney stone  Gastritis    No definite reason for pt symptoms. Pt requesting to go home with carafate and zofran. Think that is reasonable at this time    Glendell Docker, NP 10/03/14 1340  Quintella Reichert, MD 10/03/14 1622

## 2014-10-03 NOTE — ED Notes (Signed)
Pt reports was diagnosed with gastritis 3 or 4 weeks ago because was having extreme upper abd pain.  Reports took carafate and it made pain tolerable.  Pt says after eating lunch yesterday, pain became more severe and she vomited x 1.  Denies diarrhea, lbm was yesterday morning and was normal per pt.

## 2015-08-08 ENCOUNTER — Other Ambulatory Visit: Payer: Self-pay

## 2015-08-08 ENCOUNTER — Other Ambulatory Visit: Payer: Self-pay | Admitting: Obstetrics & Gynecology

## 2015-08-08 DIAGNOSIS — Z1231 Encounter for screening mammogram for malignant neoplasm of breast: Secondary | ICD-10-CM

## 2015-09-09 ENCOUNTER — Encounter: Payer: Self-pay | Admitting: Obstetrics & Gynecology

## 2015-09-09 ENCOUNTER — Ambulatory Visit (INDEPENDENT_AMBULATORY_CARE_PROVIDER_SITE_OTHER): Payer: BLUE CROSS/BLUE SHIELD | Admitting: Obstetrics & Gynecology

## 2015-09-09 VITALS — BP 102/80 | HR 92 | Ht 64.0 in | Wt 336.0 lb

## 2015-09-09 DIAGNOSIS — R102 Pelvic and perineal pain: Secondary | ICD-10-CM

## 2015-09-09 DIAGNOSIS — K5712 Diverticulitis of small intestine without perforation or abscess without bleeding: Secondary | ICD-10-CM

## 2015-09-09 MED ORDER — METRONIDAZOLE 500 MG PO TABS: 500.0000 mg | ORAL_TABLET | Freq: Two times a day (BID) | ORAL | Status: DC

## 2015-09-09 MED ORDER — CIPROFLOXACIN HCL 500 MG PO TABS: 500.0000 mg | ORAL_TABLET | Freq: Two times a day (BID) | ORAL | Status: DC

## 2015-09-09 NOTE — Progress Notes (Signed)
Patient ID: Heather Delgado, female   DOB: [birth year], 55 y.o.   MRN: [id number]      Chief Complaint  Patient presents with  . lower abdominal pain more dominant on left side    Blood pressure 102/80, pulse 92, height 5\' 4"  (1.626 m), weight (!) 336 lb (152.4 kg).  55 y.o. No obstetric history on file. No LMP recorded. Patient is postmenopausal. The current method of family planning is post menopausal status. Past Surgical History:  Procedure Laterality Date  . CARPAL TUNNEL RELEASE    . CESAREAN SECTION    . CHOLECYSTECTOMY    . I&D EXTREMITY  12/30/2011   Procedure: IRRIGATION AND DEBRIDEMENT EXTREMITY;  Surgeon: Roseanne Kaufman, MD;  Location: [place];  Service: Orthopedics;  Laterality: Right;  1st finger  . INFECTED SKIN DEBRIDEMENT  12/29/2011   RIGHT INDEX   . KNEE SURGERY      Subjective Pt with several days of LLQ pain Low grade fever No emesis or diarrhea Not had similar in the past Concerned about left ovary  Objective abd soft tender LLQ no rebound Vulva:  normal appearing vulva with no masses, tenderness or lesions Vagina:  normal mucosa, no discharge Cervix:  cervical motion tenderness Uterus:  normal size, contour, position, consistency, mobility, non-tender and tender on motion Adnexa: ovaries:present,  tenderness left   Pertinent ROS   Labs or studies     Impression Diagnoses this Encounter::   ICD-9-CM ICD-10-CM   1. Diverticulitis of small intestine without perforation or abscess without bleeding 562.01 K57.12   2. Pelvic pain in female 625.9 R10.2 US Pelvis Complete     US Transvaginal Non-OB    Established relevant diagnosis(es):   Plan/Recommendations: Meds ordered this encounter  Medications  . EPINEPHrine (EPIPEN 2-PAK) 0.3 mg/0.3 mL IJ SOAJ injection    Sig: 0.3 mg once.   Marland Kitchen DISCONTD: metroNIDAZOLE (FLAGYL) 500 MG tablet    Sig: Take 1 tablet (500 mg total) by mouth 2 (two) times daily.    Dispense:  28 tablet    Refill:  0  .  DISCONTD: ciprofloxacin (CIPRO) 500 MG tablet    Sig: Take 1 tablet (500 mg total) by mouth 2 (two) times daily.    Dispense:  28 tablet    Refill:  0    Labs or Scans Ordered: Orders Placed This Encounter  Procedures  . US Pelvis Complete  . US Transvaginal Non-OB    Management:: cipro flagyl  Follow up sonogram Re evaluation  Follow up Return in about 1 week (around 09/16/2015) for GYN sono, Follow up, with Dr Elonda Husky.     All questions were answered.

## 2015-09-10 ENCOUNTER — Other Ambulatory Visit: Payer: Self-pay | Admitting: Obstetrics & Gynecology

## 2015-09-10 DIAGNOSIS — Z1231 Encounter for screening mammogram for malignant neoplasm of breast: Secondary | ICD-10-CM

## 2015-09-16 ENCOUNTER — Other Ambulatory Visit: Payer: BLUE CROSS/BLUE SHIELD

## 2015-09-16 ENCOUNTER — Ambulatory Visit: Payer: BLUE CROSS/BLUE SHIELD | Admitting: Obstetrics & Gynecology

## 2015-10-03 ENCOUNTER — Ambulatory Visit
Admission: RE | Admit: 2015-10-03 | Discharge: 2015-10-03 | Disposition: A | Discharge status: Home / Self Care | Payer: BLUE CROSS/BLUE SHIELD | Source: Ambulatory Visit | Attending: Obstetrics & Gynecology | Admitting: Obstetrics & Gynecology

## 2015-10-03 DIAGNOSIS — Z1231 Encounter for screening mammogram for malignant neoplasm of breast: Secondary | ICD-10-CM

## 2015-10-04 ENCOUNTER — Ambulatory Visit (INDEPENDENT_AMBULATORY_CARE_PROVIDER_SITE_OTHER): Payer: BLUE CROSS/BLUE SHIELD

## 2015-10-04 ENCOUNTER — Encounter: Payer: Self-pay | Admitting: Obstetrics & Gynecology

## 2015-10-04 ENCOUNTER — Ambulatory Visit (INDEPENDENT_AMBULATORY_CARE_PROVIDER_SITE_OTHER): Payer: BLUE CROSS/BLUE SHIELD | Admitting: Obstetrics & Gynecology

## 2015-10-04 VITALS — BP 122/80 | HR 84 | Ht 64.5 in | Wt 341.2 lb

## 2015-10-04 DIAGNOSIS — R102 Pelvic and perineal pain: Secondary | ICD-10-CM

## 2015-10-04 DIAGNOSIS — K5732 Diverticulitis of large intestine without perforation or abscess without bleeding: Secondary | ICD-10-CM

## 2015-10-04 DIAGNOSIS — Z6841 Body Mass Index (BMI) 40.0 and over, adult: Secondary | ICD-10-CM

## 2015-10-04 DIAGNOSIS — E669 Obesity, unspecified: Secondary | ICD-10-CM

## 2015-10-04 DIAGNOSIS — N854 Malposition of uterus: Secondary | ICD-10-CM

## 2015-10-04 NOTE — Progress Notes (Signed)
PELVIC US TA/TV: Homogenous anteverted uterus wnl,EEC 2.5 mm,normal ov's bilat,no free fluid seen,no pain during ultrasound

## 2015-10-04 NOTE — Progress Notes (Signed)
      Chief Complaint  Patient presents with  . Follow-up    ultrasound today    Blood pressure 122/80, pulse 84, height 5' 4.5" (1.638 m), weight (!) 341 lb 3.2 oz (154.8 kg).  55 y.o. No obstetric history on file. No LMP recorded. Patient is postmenopausal. The current method of family planning is post menopausal status.  Subjective Pt states feels much better Placed on cipro and flagyl 09/09/2015 and started felling better in 2 days, finished her course of therapy  Sonogram today was scheduled as further eval and is normal see report  Objective GYNECOLOGIC SONOGRAM   Heather Delgado is a 55 y.o. postmenopausal,she is her for a pelvic sonogram for pelvic pain.  Uterus                      10.0 x 5.9 x 4.8 cm, homogenous anteverted uterus wnl  Endometrium          2.5 mm, symmetrical, wnl  Right ovary             1.5 x 1.7 x 1.2 cm, wnl  Left ovary                1.8 x 1.8 x 1.4 cm, wnl  No free fluid,no pain during ultrasound,ov's appear to be mobile.  Technician Comments:  PELVIC US TA/TV: Homogenous anteverted uterus wnl,EEC 2.5 mm,normal ov's bilat,no free fluid seen,no pain during ultrasound    [employer] 10/04/2015 11:48 AM  Clinical Impression and recommendations:  I have reviewed the sonogram results above, combined with the patient's current clinical course, below are my impressions and any appropriate recommendations for management based on the sonographic findings.  Uterus is normal size no pathology Endometrium thin symmetrical no pathology consistent with postmenopausal state The ovaries are quite small consistent with postmenopausal state A pelvic pathology   Heather Delgado H 10/04/2015 12:42 PM  Pertinent ROS No fever no constitutional symptoms   Labs or studies     Impression Diagnoses this Encounter::   ICD-9-CM ICD-10-CM   1. Diverticulitis of large intestine without perforation or abscess without bleeding 562.11  K57.32     Established relevant diagnosis(es):   Plan/Recommendations: No orders of the defined types were placed in this encounter.   Labs or Scans Ordered: No orders of the defined types were placed in this encounter.   Management:: Recommend see DR Laural Golden again for colonoscopy  Follow up Return if symptoms worsen or fail to improve.        Face to face time:  10 minutes  Greater than 50% of the visit time was spent in counseling and coordination of care with the patient.  The summary and outline of the counseling and care coordination is summarized in the note above.   All questions were answered.

## 2015-12-09 ENCOUNTER — Telehealth (INDEPENDENT_AMBULATORY_CARE_PROVIDER_SITE_OTHER): Payer: Self-pay | Admitting: *Deleted

## 2015-12-09 NOTE — Telephone Encounter (Signed)
Last TCS 2008 (epic)--when is patient due for repeat -- please advise

## 2015-12-12 NOTE — Telephone Encounter (Signed)
Next year unless she is having symptoms pertaining to lower GI tract.

## 2015-12-16 NOTE — Telephone Encounter (Signed)
Left message for patient with recommendation and to give me a call if she wanted to go ahead and schedule TCS

## 2016-02-28 ENCOUNTER — Encounter (INDEPENDENT_AMBULATORY_CARE_PROVIDER_SITE_OTHER): Payer: Self-pay | Admitting: *Deleted

## 2016-03-24 ENCOUNTER — Encounter: Payer: Self-pay | Admitting: Internal Medicine

## 2016-07-17 DIAGNOSIS — R002 Palpitations: Secondary | ICD-10-CM | POA: Diagnosis not present

## 2016-07-17 DIAGNOSIS — M170 Bilateral primary osteoarthritis of knee: Secondary | ICD-10-CM | POA: Diagnosis not present

## 2016-07-17 DIAGNOSIS — E782 Mixed hyperlipidemia: Secondary | ICD-10-CM | POA: Diagnosis not present

## 2016-07-17 DIAGNOSIS — R7301 Impaired fasting glucose: Secondary | ICD-10-CM | POA: Diagnosis not present

## 2016-09-04 DIAGNOSIS — Z6841 Body Mass Index (BMI) 40.0 and over, adult: Secondary | ICD-10-CM | POA: Diagnosis not present

## 2016-09-04 DIAGNOSIS — M170 Bilateral primary osteoarthritis of knee: Secondary | ICD-10-CM | POA: Diagnosis not present

## 2016-09-04 DIAGNOSIS — E882 Lipomatosis, not elsewhere classified: Secondary | ICD-10-CM | POA: Diagnosis not present

## 2016-11-27 DIAGNOSIS — Z713 Dietary counseling and surveillance: Secondary | ICD-10-CM | POA: Diagnosis not present

## 2016-11-27 DIAGNOSIS — Z6841 Body Mass Index (BMI) 40.0 and over, adult: Secondary | ICD-10-CM | POA: Diagnosis not present

## 2016-11-27 DIAGNOSIS — E882 Lipomatosis, not elsewhere classified: Secondary | ICD-10-CM | POA: Diagnosis not present

## 2017-01-13 DIAGNOSIS — R7301 Impaired fasting glucose: Secondary | ICD-10-CM | POA: Diagnosis not present

## 2017-01-13 DIAGNOSIS — E782 Mixed hyperlipidemia: Secondary | ICD-10-CM | POA: Diagnosis not present

## 2017-02-14 ENCOUNTER — Emergency Department (HOSPITAL_COMMUNITY)
Admission: EM | Admit: 2017-02-14 | Discharge: 2017-02-14 | Disposition: A | Discharge status: Home / Self Care | Payer: PRIVATE HEALTH INSURANCE | Attending: Emergency Medicine | Admitting: Emergency Medicine

## 2017-02-14 ENCOUNTER — Encounter (HOSPITAL_COMMUNITY): Payer: Self-pay

## 2017-02-14 ENCOUNTER — Emergency Department (HOSPITAL_COMMUNITY): Payer: PRIVATE HEALTH INSURANCE

## 2017-02-14 DIAGNOSIS — Z79899 Other long term (current) drug therapy: Secondary | ICD-10-CM | POA: Insufficient documentation

## 2017-02-14 DIAGNOSIS — M4316 Spondylolisthesis, lumbar region: Secondary | ICD-10-CM | POA: Insufficient documentation

## 2017-02-14 DIAGNOSIS — Y939 Activity, unspecified: Secondary | ICD-10-CM | POA: Insufficient documentation

## 2017-02-14 DIAGNOSIS — Y990 Civilian activity done for income or pay: Secondary | ICD-10-CM | POA: Diagnosis not present

## 2017-02-14 DIAGNOSIS — S32020A Wedge compression fracture of second lumbar vertebra, initial encounter for closed fracture: Secondary | ICD-10-CM | POA: Insufficient documentation

## 2017-02-14 DIAGNOSIS — W000XXA Fall on same level due to ice and snow, initial encounter: Secondary | ICD-10-CM | POA: Diagnosis not present

## 2017-02-14 DIAGNOSIS — J45909 Unspecified asthma, uncomplicated: Secondary | ICD-10-CM | POA: Diagnosis not present

## 2017-02-14 DIAGNOSIS — Y9289 Other specified places as the place of occurrence of the external cause: Secondary | ICD-10-CM | POA: Diagnosis not present

## 2017-02-14 DIAGNOSIS — I10 Essential (primary) hypertension: Secondary | ICD-10-CM | POA: Insufficient documentation

## 2017-02-14 DIAGNOSIS — S3992XA Unspecified injury of lower back, initial encounter: Secondary | ICD-10-CM | POA: Diagnosis present

## 2017-02-14 MED ORDER — HYDROMORPHONE HCL 1 MG/ML IJ SOLN
1.0000 mg | Freq: Once | INTRAMUSCULAR | Status: AC
  Administered 2017-02-14: 1 mg via INTRAMUSCULAR
  Filled 2017-02-14: qty 1

## 2017-02-14 MED ORDER — OXYCODONE-ACETAMINOPHEN 5-325 MG PO TABS: 1.0000 | ORAL_TABLET | ORAL | 0 refills | Status: DC | PRN

## 2017-02-14 MED ORDER — METHOCARBAMOL 500 MG PO TABS: 1000.0000 mg | ORAL_TABLET | Freq: Four times a day (QID) | ORAL | 0 refills | Status: AC

## 2017-02-14 MED ORDER — ONDANSETRON 4 MG PO TBDP
4.0000 mg | ORAL_TABLET | Freq: Once | ORAL | Status: AC
  Administered 2017-02-14: 4 mg via ORAL
  Filled 2017-02-14: qty 1

## 2017-02-14 NOTE — Discharge Instructions (Signed)
You are a candidate for kyphoplasty to repair this fractured lumbar vertebrae and will need to arrange an appointment with Dr. Estanislado Pandy at [facility] for this procedure.  In the interim,  an MRI has been ordered as an outpatient which you will need before you have the procedure.  Call [phone] to get this scheduled (it has been ordered but you will need to call tomorrow to schedule it here).  You may take the oxycodone prescribed for pain relief.  This will make you drowsy - do not drive within 4 hours of taking this medication.

## 2017-02-14 NOTE — ED Provider Notes (Signed)
[facility] EMERGENCY DEPARTMENT Provider Note   CSN: [id number] Arrival date & time: 02/14/17  1341     History   Chief Complaint Chief Complaint  Patient presents with  . Fall    HPI Heather Delgado is a 56 y.o. female with a history of arthritis, chronic back pain and degenerative joint disease along with hypertension, asthma and acid reflux presenting with persistent pain in her lower back since tripping and falling on ice December 12 while at work.  She was seen at an outside hospital and x-rays at that time per patient's report revealed arthritis but no other acute injury.  She was placed on tramadol (takes qhs) and  muscle relaxers which were helping but ran out several days ago and is now experiencing worsening pain, severe since lunch time today despite denying any new injury. There is no radiation of pain and denies urinary or fecal incontinence or retention.  The history is provided by the patient.    Past Medical History:  Diagnosis Date  . Arthritis   . Asthma   . Back pain   . Diverticulitis   . DJD (degenerative joint disease)   . Hypertension   . Reflux   . Spondylolysis     Patient Active Problem List   Diagnosis Date Noted  . Abdominal pain, left lower quadrant 08/23/2013    Past Surgical History:  Procedure Laterality Date  . CARPAL TUNNEL RELEASE    . CESAREAN SECTION    . CHOLECYSTECTOMY    . FINGER SURGERY    . I&D EXTREMITY  12/30/2011   Procedure: IRRIGATION AND DEBRIDEMENT EXTREMITY;  Surgeon: Roseanne Kaufman, MD;  Location: [place];  Service: Orthopedics;  Laterality: Right;  1st finger  . INFECTED SKIN DEBRIDEMENT  12/29/2011   RIGHT INDEX   . KNEE SURGERY      OB History    No data available       Home Medications    Prior to Admission medications   Medication Sig Start Date End Date Taking? Authorizing Provider  albuterol (PROVENTIL HFA;VENTOLIN HFA) 108 (90 BASE) MCG/ACT inhaler Inhale 2 puffs into the lungs every 6 (six) hours as  needed for wheezing or shortness of breath.    [provider]  atenolol (TENORMIN) 25 MG tablet Take 25 mg by mouth every evening. For palpitations    [provider]  bismuth subsalicylate (PEPTO BISMOL) 262 MG/15ML suspension Take 30 mLs by mouth every 6 (six) hours as needed for indigestion.    [provider]  budesonide (PULMICORT) 0.5 MG/2ML nebulizer solution Take 0.5 mg by nebulization daily. Reported on 09/09/2015    [provider]  budesonide-formoterol (SYMBICORT) 160-4.5 MCG/ACT inhaler Inhale 2 puffs into the lungs 2 (two) times daily.    [provider]  cetirizine (ZYRTEC) 10 MG tablet Take 10 mg by mouth daily.    [provider]  clobetasol ointment (TEMOVATE) 2.59 % Apply 1 application topically daily as needed. Likens Disease    [provider]  DULoxetine (CYMBALTA) 60 MG capsule Take 60 mg by mouth every morning.     [provider]  EPINEPHrine (EPIPEN 2-PAK) 0.3 mg/0.3 mL IJ SOAJ injection 0.3 mg once.  09/19/12   [provider]  famotidine-calcium carbonate-magnesium hydroxide (PEPCID COMPLETE) 10-800-165 MG CHEW chewable tablet Chew 1 tablet by mouth daily as needed (acid reflux).    [provider]  furosemide (LASIX) 40 MG tablet Take 40 mg by mouth daily as needed for  edema.    [provider]  HYDROcodone-acetaminophen (NORCO/VICODIN) 5-325 MG per tablet Take 1 tablet by mouth every 6 (six) hours as needed for moderate pain.    [provider]  lisinopril (PRINIVIL,ZESTRIL) 20 MG tablet Take 20 mg by mouth at bedtime.    [provider]  methocarbamol (ROBAXIN) 500 MG tablet Take 2 tablets (1,000 mg total) by mouth 4 (four) times daily for 10 days. 02/14/17 02/24/17  [name], PA-C  montelukast (SINGULAIR) 10 MG tablet Take 10 mg by mouth daily.     [provider]  oxyCODONE-acetaminophen (PERCOCET/ROXICET) 5-325 MG tablet Take 1 tablet by mouth  every 4 (four) hours as needed. 02/14/17   [name], PA-C  Probiotic Product (PROBIOTIC DAILY PO) Take 1 tablet by mouth at bedtime.    [provider]    Family History Family History  Problem Relation Age of Onset  . Stroke Maternal Grandmother   . Heart disease Paternal Grandmother   . Heart disease Maternal Grandfather   . Cancer Maternal Grandfather   . Alzheimer's disease Maternal Grandfather   . Heart disease Paternal Grandfather     Social History Social History   Tobacco Use  . Smoking status: Never Smoker  . Smokeless tobacco: Never Used  Substance Use Topics  . Alcohol use: No    Comment: occ  . Drug use: No     Allergies   Duramorph [morphine]; Sulfa antibiotics; Triamterene; Other; and Latex   Review of Systems Review of Systems  Constitutional: Negative for fever.  Respiratory: Negative for shortness of breath.   Cardiovascular: Negative for chest pain and leg swelling.  Gastrointestinal: Negative for abdominal distention, abdominal pain and constipation.  Genitourinary: Negative for difficulty urinating, dysuria, flank pain, frequency and urgency.  Musculoskeletal: Positive for back pain. Negative for gait problem and joint swelling.  Skin: Negative for rash.  Neurological: Negative for weakness and numbness.     Physical Exam Updated Vital Signs BP 137/81 (BP Location: Right Wrist)   Pulse 97   Temp 98 F (36.7 C) (Oral)   Resp 16   Ht 5\' 5"  (1.651 m)   Wt 135.2 kg (298 lb)   SpO2 100%   BMI 49.59 kg/m   Physical Exam  Constitutional: She appears well-developed and well-nourished.  HENT:  Head: Normocephalic.  Eyes: Conjunctivae are normal.  Neck: Normal range of motion. Neck supple.  Cardiovascular: Normal rate and intact distal pulses.  Pedal pulses normal.  Pulmonary/Chest: Effort normal.  Abdominal: Soft. Bowel sounds are normal. She exhibits no distension and no mass.  Musculoskeletal: Normal range of motion. She  exhibits no edema.       Lumbar back: She exhibits tenderness. She exhibits no bony tenderness, no swelling, no edema and no spasm.       Back:  Neurological: She is alert. She has normal strength. She displays no atrophy and no tremor. No sensory deficit. Gait normal.  Reflex Scores:      Patellar reflexes are 2+ on the right side and 2+ on the left side.      Achilles reflexes are 2+ on the right side and 2+ on the left side. No strength deficit noted in hip and knee flexor and extensor muscle groups.  Ankle flexion and extension intact.  Skin: Skin is warm and dry.  Psychiatric: She has a normal mood and affect.  Nursing note and vitals reviewed.    ED Treatments / Results  Labs (all labs ordered are listed,  but only abnormal results are displayed) Labs Reviewed - No data to display  EKG  EKG Interpretation None       Radiology Ct Lumbar Spine Wo Contrast  Result Date: 02/14/2017 CLINICAL DATA:  Patient fell on ice December 12, back pain since that time. EXAM: CT LUMBAR SPINE WITHOUT CONTRAST TECHNIQUE: Multidetector CT imaging of the lumbar spine was performed without intravenous contrast administration. Multiplanar CT image reconstructions were also generated. COMPARISON:  CT abdomen pelvis 18 2016. FINDINGS: Segmentation: 5 lumbar type vertebrae. Alignment: There is 7 mm anterolisthesis L4-5 which is related to advanced facet arthropathy. No definite pars defects. Vertebrae: There is acute compression fracture of L2, mild anterior wedging. There is depression of the LEFT hemivertebra superiorly, without involvement of the pedicles or post elements. No retropulsion. There is chronic sclerosis at T12-L1 related disc space narrowing. No other fractures are seen. Paraspinal and other soft tissues: Aortic atherosclerosis. Disc levels: L1-L2:  Unremarkable.  No significant retropulsion. L2-L3:  Unremarkable. L3-L4:  Unremarkable. L4-L5: 7 mm anterolisthesis. Disc uncovering with facet  arthropathy and ligamentum flavum hypertrophy contributes to severe stenosis. BILATERAL L4 and L5 nerve root impingement are likely. L5-S1: Good disc height is observed. There is advanced facet arthropathy. No definite disc protrusion or stenosis. IMPRESSION: Acute L2 posttraumatic/osteopenic vertebral body compression fracture, mild anterior wedging without retropulsion. No involvement of posterior elements. Severe stenosis at L4-5 secondary to stenosis, posterior element hypertrophy, and disc uncovering. Electronically Signed   By: Staci Righter M.D.   On: 02/14/2017 17:53    Procedures Procedures (including critical care time)  Medications Ordered in ED Medications  HYDROmorphone (DILAUDID) injection 1 mg (1 mg Intramuscular Given 02/14/17 1644)  ondansetron (ZOFRAN-ODT) disintegrating tablet 4 mg (4 mg Oral Given 02/14/17 1643)     Initial Impression / Assessment and Plan / ED Course  I have reviewed the triage vital signs and the nursing notes.  Pertinent labs & imaging results that were available during my care of the patient were reviewed by me and considered in my medical decision making (see chart for details).     Discussed imaging results with patient and family.  Also discussed the compression fracture with Dr. Jola Baptist who feels this patient is a good candidate for kyphoplasty.  I will refer her to Dr. Marjory Lies  He also advised she will need an MRI before such a procedure, this was ordered and patient knows to call tomorrow to schedule an appointment time for this test.  She was prescribed oxycodone and Robaxin, discussed heat, activity as tolerated.    No neuro deficit on exam or by history to suggest emergent or surgical presentation.  Also discussed worsened sx that should prompt immediate re-evaluation including distal weakness, bowel/bladder retention/incontinence.        Final Clinical Impressions(s) / ED Diagnoses   Final diagnoses:  Closed compression fracture of  second lumbar vertebra, initial encounter ([employer])  Spondylolisthesis of lumbar region    ED Discharge Orders        Ordered    MR LUMBAR SPINE WO CONTRAST     02/14/17 1823    methocarbamol (ROBAXIN) 500 MG tablet  4 times daily     02/14/17 1824    oxyCODONE-acetaminophen (PERCOCET/ROXICET) 5-325 MG tablet  Every 4 hours PRN     02/14/17 1824       [name], PA-C 02/14/17 1837    Daleen Bo, MD 02/14/17 2315

## 2017-02-14 NOTE — ED Triage Notes (Signed)
Pt reports fell on ice on dec 12 at work.  Says was treated in [place] and was given muscle relaxers.  Reports was told her lower back had a lot of arthritis and pt has been very sore.  Reports ran out of her muscle relaxers a few days ago and soreness is getting worse.  Says pain is worse when she tries to move her r leg.

## 2017-02-15 ENCOUNTER — Other Ambulatory Visit (HOSPITAL_COMMUNITY): Payer: Self-pay | Admitting: Interventional Radiology

## 2017-02-15 DIAGNOSIS — S32020A Wedge compression fracture of second lumbar vertebra, initial encounter for closed fracture: Secondary | ICD-10-CM

## 2017-02-18 ENCOUNTER — Ambulatory Visit (HOSPITAL_COMMUNITY)
Admission: RE | Admit: 2017-02-18 | Discharge: 2017-02-18 | Disposition: A | Discharge status: Home / Self Care | Payer: No Typology Code available for payment source | Source: Ambulatory Visit | Attending: Emergency Medicine | Admitting: Emergency Medicine

## 2017-02-20 ENCOUNTER — Ambulatory Visit (HOSPITAL_COMMUNITY)
Admission: RE | Admit: 2017-02-20 | Discharge: 2017-02-20 | Disposition: A | Discharge status: Home / Self Care | Payer: PRIVATE HEALTH INSURANCE | Source: Ambulatory Visit | Attending: Emergency Medicine | Admitting: Emergency Medicine

## 2017-02-20 DIAGNOSIS — M4316 Spondylolisthesis, lumbar region: Secondary | ICD-10-CM | POA: Diagnosis not present

## 2017-02-20 DIAGNOSIS — M1288 Other specific arthropathies, not elsewhere classified, other specified site: Secondary | ICD-10-CM | POA: Diagnosis not present

## 2017-02-20 DIAGNOSIS — M48061 Spinal stenosis, lumbar region without neurogenic claudication: Secondary | ICD-10-CM | POA: Diagnosis present

## 2017-02-20 DIAGNOSIS — M47896 Other spondylosis, lumbar region: Secondary | ICD-10-CM | POA: Diagnosis not present

## 2017-03-04 ENCOUNTER — Other Ambulatory Visit (HOSPITAL_COMMUNITY): Payer: Self-pay | Admitting: Interventional Radiology

## 2017-03-04 ENCOUNTER — Ambulatory Visit (HOSPITAL_COMMUNITY)
Admission: RE | Admit: 2017-03-04 | Discharge: 2017-03-04 | Disposition: A | Discharge status: Home / Self Care | Payer: Self-pay | Source: Ambulatory Visit | Attending: Interventional Radiology | Admitting: Interventional Radiology

## 2017-03-04 DIAGNOSIS — S32020A Wedge compression fracture of second lumbar vertebra, initial encounter for closed fracture: Secondary | ICD-10-CM

## 2017-03-04 HISTORY — PX: IR RADIOLOGIST EVAL & MGMT: IMG5224

## 2017-03-05 ENCOUNTER — Other Ambulatory Visit (HOSPITAL_COMMUNITY): Payer: Self-pay | Admitting: Interventional Radiology

## 2017-03-08 ENCOUNTER — Encounter (HOSPITAL_COMMUNITY): Payer: Self-pay | Admitting: Interventional Radiology

## 2017-03-10 ENCOUNTER — Other Ambulatory Visit: Payer: Self-pay | Admitting: Radiology

## 2017-03-11 ENCOUNTER — Other Ambulatory Visit: Payer: Self-pay | Admitting: General Surgery

## 2017-03-11 ENCOUNTER — Other Ambulatory Visit: Payer: Self-pay | Admitting: Radiology

## 2017-03-12 ENCOUNTER — Other Ambulatory Visit (HOSPITAL_COMMUNITY): Payer: Self-pay | Admitting: Interventional Radiology

## 2017-03-12 ENCOUNTER — Encounter (HOSPITAL_COMMUNITY): Payer: Self-pay

## 2017-03-12 ENCOUNTER — Ambulatory Visit (HOSPITAL_COMMUNITY)
Admission: RE | Admit: 2017-03-12 | Discharge: 2017-03-12 | Disposition: A | Discharge status: Home / Self Care | Payer: PRIVATE HEALTH INSURANCE | Source: Ambulatory Visit | Attending: Interventional Radiology | Admitting: Interventional Radiology

## 2017-03-12 DIAGNOSIS — I10 Essential (primary) hypertension: Secondary | ICD-10-CM | POA: Diagnosis not present

## 2017-03-12 DIAGNOSIS — M479 Spondylosis, unspecified: Secondary | ICD-10-CM | POA: Diagnosis not present

## 2017-03-12 DIAGNOSIS — S32020A Wedge compression fracture of second lumbar vertebra, initial encounter for closed fracture: Secondary | ICD-10-CM

## 2017-03-12 DIAGNOSIS — Y990 Civilian activity done for income or pay: Secondary | ICD-10-CM | POA: Insufficient documentation

## 2017-03-12 DIAGNOSIS — M48061 Spinal stenosis, lumbar region without neurogenic claudication: Secondary | ICD-10-CM | POA: Diagnosis not present

## 2017-03-12 DIAGNOSIS — W009XXA Unspecified fall due to ice and snow, initial encounter: Secondary | ICD-10-CM | POA: Insufficient documentation

## 2017-03-12 DIAGNOSIS — S32029A Unspecified fracture of second lumbar vertebra, initial encounter for closed fracture: Secondary | ICD-10-CM | POA: Diagnosis present

## 2017-03-12 DIAGNOSIS — Z79899 Other long term (current) drug therapy: Secondary | ICD-10-CM | POA: Diagnosis not present

## 2017-03-12 DIAGNOSIS — J45909 Unspecified asthma, uncomplicated: Secondary | ICD-10-CM | POA: Insufficient documentation

## 2017-03-12 HISTORY — PX: IR VERTEBROPLASTY LUMBAR BX INC UNI/BIL INC/INJECT/IMAGING: IMG5516

## 2017-03-12 LAB — PROTIME-INR
INR: 0.95
PROTHROMBIN TIME: 12.6 s (ref 11.4–15.2)

## 2017-03-12 LAB — BASIC METABOLIC PANEL
ANION GAP: 11 (ref 5–15)
BUN: 14 mg/dL (ref 6–20)
CHLORIDE: 106 mmol/L (ref 101–111)
CO2: 23 mmol/L (ref 22–32)
Calcium: 9.1 mg/dL (ref 8.9–10.3)
Creatinine, Ser: 0.65 mg/dL (ref 0.44–1.00)
GFR calc Af Amer: >60 mL/min (ref >60)
Glucose, Bld: 89 mg/dL (ref 65–99)
POTASSIUM: 4 mmol/L (ref 3.5–5.1)
SODIUM: 140 mmol/L (ref 135–145)

## 2017-03-12 LAB — APTT: APTT: 24 s (ref 24–36)

## 2017-03-12 LAB — CBC
HEMATOCRIT: 44.8 % (ref 36.0–46.0)
HEMOGLOBIN: 15 g/dL (ref 12.0–15.0)
MCH: 29.8 pg (ref 26.0–34.0)
MCHC: 33.5 g/dL (ref 30.0–36.0)
MCV: 89.1 fL (ref 78.0–100.0)
PLATELETS: 268 10*3/uL (ref 150–400)
RBC: 5.03 MIL/uL (ref 3.87–5.11)
RDW: 14 % (ref 11.5–15.5)
WBC: 5.5 10*3/uL (ref 4.0–10.5)

## 2017-03-12 MED ORDER — SODIUM CHLORIDE 0.9 % IV SOLN
INTRAVENOUS | Status: AC | PRN
  Administered 2017-03-12: 10 mL/h via INTRAVENOUS

## 2017-03-12 MED ORDER — CEFAZOLIN SODIUM-DEXTROSE 2-4 GM/100ML-% IV SOLN
2.0000 g | INTRAVENOUS | Status: AC
  Administered 2017-03-12: 2 g via INTRAVENOUS

## 2017-03-12 MED ORDER — GELATIN ABSORBABLE 12-7 MM EX MISC
CUTANEOUS | Status: AC
  Filled 2017-03-12: qty 1

## 2017-03-12 MED ORDER — TOBRAMYCIN SULFATE 1.2 G IJ SOLR
INTRAMUSCULAR | Status: AC
  Filled 2017-03-12: qty 1.2

## 2017-03-12 MED ORDER — FENTANYL CITRATE (PF) 100 MCG/2ML IJ SOLN
INTRAMUSCULAR | Status: AC
  Filled 2017-03-12: qty 4

## 2017-03-12 MED ORDER — MIDAZOLAM HCL 2 MG/2ML IJ SOLN
INTRAMUSCULAR | Status: AC | PRN
  Administered 2017-03-12 (×3): 1 mg via INTRAVENOUS

## 2017-03-12 MED ORDER — IOPAMIDOL (ISOVUE-300) INJECTION 61%
INTRAVENOUS | Status: AC
  Administered 2017-03-12: 2 mL
  Filled 2017-03-12: qty 50

## 2017-03-12 MED ORDER — BUPIVACAINE HCL (PF) 0.5 % IJ SOLN
INTRAMUSCULAR | Status: AC | PRN
  Administered 2017-03-12: 20 mL

## 2017-03-12 MED ORDER — IOPAMIDOL (ISOVUE-300) INJECTION 61%
INTRAVENOUS | Status: AC
  Filled 2017-03-12: qty 50

## 2017-03-12 MED ORDER — BUPIVACAINE HCL (PF) 0.5 % IJ SOLN
INTRAMUSCULAR | Status: AC
  Filled 2017-03-12: qty 30

## 2017-03-12 MED ORDER — SODIUM CHLORIDE 0.9 % IV SOLN
INTRAVENOUS | Status: AC
  Administered 2017-03-12: 12:00:00 via INTRAVENOUS

## 2017-03-12 MED ORDER — CEFAZOLIN SODIUM-DEXTROSE 2-4 GM/100ML-% IV SOLN
INTRAVENOUS | Status: AC
  Administered 2017-03-12: 2 g via INTRAVENOUS
  Filled 2017-03-12: qty 100

## 2017-03-12 MED ORDER — SODIUM CHLORIDE 0.9 % IV SOLN: INTRAVENOUS | Status: DC

## 2017-03-12 MED ORDER — FENTANYL CITRATE (PF) 100 MCG/2ML IJ SOLN
INTRAMUSCULAR | Status: AC | PRN
  Administered 2017-03-12 (×6): 25 ug via INTRAVENOUS

## 2017-03-12 MED ORDER — MIDAZOLAM HCL 2 MG/2ML IJ SOLN
INTRAMUSCULAR | Status: AC
  Filled 2017-03-12: qty 4

## 2017-03-12 MED ORDER — TOBRAMYCIN SULFATE 1.2 G IJ SOLR
INTRAMUSCULAR | Status: AC | PRN
  Administered 2017-03-12: .1 g via TOPICAL

## 2017-03-12 NOTE — Procedures (Signed)
S/PL2 VP 

## 2017-03-12 NOTE — Sedation Documentation (Signed)
Patient is resting comfortably. 

## 2017-03-12 NOTE — Discharge Instructions (Addendum)
Percutaneous Vertebroplasty, Care After °These instructions give you information on caring for yourself after your procedure. Your doctor may also give you more specific instructions. Call your doctor if you have any problems or questions after your procedure. °Follow these instructions at home: °· Take medicine as told by your doctor. °· Keep your wound dry and covered for 24 hours or as told by your doctor. °· Ask your doctor when you can bathe or shower. °· Put an ice pack on your wound. °? Put ice in a plastic bag. °? Place a towel between your skin and the bag. °? Leave the ice on for 15-20 minutes, 3-4 times a day. °· Rest in your bed for 24 hours or as told by your doctor. °· Return to normal activities as told by your doctor. °· Ask your doctor what stretches and exercises you can do. °· Do not bend or lift anything heavy as told by your doctor. °Contact a doctor if: °· Your wound becomes red, puffy (swollen), or tender to the touch. °· You are bleeding or leaking fluid from the wound. °· You are sick to your stomach (nauseous) or throw up (vomit) for more than 24 hours after the procedure. °· Your back pain does not get better. °· You have a fever. °Get help right away if: °· You have bad back pain that comes on suddenly. °· You cannot control when you pee (urinate) or poop (bowel movement). °· You lose feeling (numbness) or have tingling in your legs or feet, or they become weak. °· You have sudden weakness in your arms or legs. °· You have shooting pain down your legs. °· You have chest pain or a hard time breathing. °· You feel dizzy or pass out (faint). °· Your vision changes or you cannot talk as you normally do. °This information is not intended to replace advice given to you by your health care provider. Make sure you discuss any questions you have with your health care provider. °Document Released: 05/06/2009 Document Revised: 07/18/2015 Document Reviewed: 10/18/2012 °Elsevier Interactive Patient  Education © 2018 [employer] °Balloon Kyphoplasty, Care After °Refer to this sheet in the next few weeks. These instructions provide you with information about caring for yourself after your procedure. Your health care provider may also give you more specific instructions. Your treatment has been planned according to current medical practices, but problems sometimes occur. Call your health care provider if you have any problems or questions after your procedure. °What can I expect after the procedure? °After your procedure, it is common to have back pain. °Follow these instructions at home: °Incision care °· Follow instructions from your health care provider about how to take care of your incisions. Make sure you: °? Wash your hands with soap and water before you change your bandage (dressing). If soap and water are not available, use hand sanitizer. °? Change your dressing as told by your health care provider. °? Leave stitches (sutures), skin glue, or adhesive strips in place. These skin closures may need to be in place for 2 weeks or longer. If adhesive strip edges start to loosen and curl up, you may trim the loose edges. Do not remove adhesive strips completely unless your health care provider tells you to do that. °· Check your incision area every day for signs of infection. Watch for: °? Redness, swelling, or pain. °? Fluid, blood, or pus. °· Keep your dressing dry until your health care provider says that it can be removed. °  Activity ° °· Rest your back and avoid intense physical activity for as long as told by your health care provider. °· Return to your normal activities as told by your health care provider. Ask your health care provider what activities are safe for you. °· Do not lift anything that is heavier than 10 lb (4.5 kg). This is about the weight of a gallon of milk. You may need to avoid heavy lifting for several weeks. °General instructions °· Take over-the-counter and prescription medicines  only as told by your health care provider. °· If directed, apply ice to the painful area: °? Put ice in a plastic bag. °? Place a towel between your skin and the bag. °? Leave the ice on for 20 minutes, 2-3 times per day. °· Do not use tobacco products, including cigarettes, chewing tobacco, or e-cigarettes. If you need help quitting, ask your health care provider. °· Keep all follow-up visits as told by your health care provider. This is important. °Contact a health care provider if: °· You have a fever. °· You have redness, swelling, or pain at the site of your incisions. °· You have fluid, blood, or pus coming from your incisions. °· You have pain that gets worse or does not get better with medicine. °· You develop numbness or weakness in any part of your body. °Get help right away if: °· You have chest pain. °· You have difficulty breathing. °· You cannot move your legs. °· You cannot control your bladder or bowel movements. °· You suddenly become weak or numb on one side of your body. °· You become very confused. °· You have trouble speaking or understanding, or both. °This information is not intended to replace advice given to you by your health care provider. Make sure you discuss any questions you have with your health care provider. °Document Released: 10/31/2014 Document Revised: 07/18/2015 Document Reviewed: 06/04/2014 °Elsevier Interactive Patient Education © 2018 [employer] °Moderate Conscious Sedation, Adult, Care After °These instructions provide you with information about caring for yourself after your procedure. Your health care provider may also give you more specific instructions. Your treatment has been planned according to current medical practices, but problems sometimes occur. Call your health care provider if you have any problems or questions after your procedure. °What can I expect after the procedure? °After your procedure, it is common: °· To feel sleepy for several hours. °· To feel  clumsy and have poor balance for several hours. °· To have poor judgment for several hours. °· To vomit if you eat too soon. ° °Follow these instructions at home: °For at least 24 hours after the procedure: ° °· Do not: °? Participate in activities where you could fall or become injured. °? Drive. °? Use heavy machinery. °? Drink alcohol. °? Take sleeping pills or medicines that cause drowsiness. °? Make important decisions or sign legal documents. °? Take care of children on your own. °· Rest. °Eating and drinking °· Follow the diet recommended by your health care provider. °· If you vomit: °? Drink water, juice, or soup when you can drink without vomiting. °? Make sure you have little or no nausea before eating solid foods. °General instructions °· Have a responsible adult stay with you until you are awake and alert. °· Take over-the-counter and prescription medicines only as told by your health care provider. °· If you smoke, do not smoke without supervision. °· Keep all follow-up visits as told by your health care provider. This is   important. °Contact a health care provider if: °· You keep feeling nauseous or you keep vomiting. °· You feel light-headed. °· You develop a rash. °· You have a fever. °Get help right away if: °· You have trouble breathing. °This information is not intended to replace advice given to you by your health care provider. Make sure you discuss any questions you have with your health care provider. °Document Released: 11/30/2012 Document Revised: 07/15/2015 Document Reviewed: 06/01/2015 °Elsevier Interactive Patient Education © 2018 [employer]. ° °

## 2017-03-12 NOTE — H&P (Signed)
Chief Complaint: Patient was seen in consultation today for Lumbar 2 kyphoplasty at the request of Heather Delgado [facility]   Supervising Physician: Luanne Bras  Patient Status: [facility] - Out-pt  History of Present Illness: Heather Delgado is a 57 y.o. female   [place] on ice at work 02/03/17 Has had back pain since then Worsening  MRI 02/20/17: IMPRESSION: 1. L2 mild anterior compression deformity with edema indicating recent injury. No additional acute osseous abnormality identified. 2. Progression of lumbar spondylosis with increased L4-5 anterolisthesis and lower lumbar facet arthropathy. 3. Multifactorial moderate L4-5 canal stenosis. 4. Mild bilateral L4-5 foraminal stenosis.  Referred to Dr Estanislado Pandy Consultation 03/04/17 and now scheduled for L2 KP  Past Medical History:  Diagnosis Date  . Arthritis   . Asthma   . Back pain   . Diverticulitis   . DJD (degenerative joint disease)   . Hypertension   . Reflux   . Spondylolysis     Past Surgical History:  Procedure Laterality Date  . CARPAL TUNNEL RELEASE    . CESAREAN SECTION    . CHOLECYSTECTOMY    . FINGER SURGERY    . I&D EXTREMITY  12/30/2011   Procedure: IRRIGATION AND DEBRIDEMENT EXTREMITY;  Surgeon: Roseanne Kaufman, MD;  Location: [place];  Service: Orthopedics;  Laterality: Right;  1st finger  . INFECTED SKIN DEBRIDEMENT  12/29/2011   RIGHT INDEX   . IR RADIOLOGIST EVAL & MGMT  03/04/2017  . KNEE SURGERY      Allergies: Duramorph [morphine]; Sulfa antibiotics; Triamterene; Other; and Latex  Medications: Prior to Admission medications   Medication Sig Start Date End Date Taking? Authorizing Provider  atenolol (TENORMIN) 25 MG tablet Take 25 mg by mouth every evening. For palpitations   Yes [provider]  Biotin 5 MG TABS Take 1 tablet by mouth daily.   Yes [provider]  Bromelains (BROMELAIN PO) Take 280 mg by mouth daily.   Yes [provider]  buPROPion (WELLBUTRIN XL)  150 MG 24 hr tablet Take 150 mg by mouth daily.   Yes [provider]  cetirizine (ZYRTEC) 10 MG tablet Take 10 mg by mouth daily.   Yes [provider]  clobetasol ointment (TEMOVATE) 9.60 % Apply 1 application topically daily as needed. Likens Disease   Yes [provider]  DULoxetine (CYMBALTA) 60 MG capsule Take 60 mg by mouth every morning.    Yes [provider]  EPINEPHrine (EPIPEN 2-PAK) 0.3 mg/0.3 mL IJ SOAJ injection Inject 0.3 mg into the muscle daily as needed.  09/19/12  Yes [provider]  HYDROcodone-acetaminophen (NORCO/VICODIN) 5-325 MG per tablet Take 1 tablet by mouth every 6 (six) hours as needed for moderate pain.   Yes [provider]  lisinopril (PRINIVIL,ZESTRIL) 20 MG tablet Take 20 mg by mouth at bedtime.   Yes [provider]  Magnesium Oxide (MAG-200) 200 MG TABS Take by mouth.   Yes [provider]  methocarbamol (ROBAXIN) 500 MG tablet Take 500 mg by mouth every 8 (eight) hours as needed for muscle spasms.   Yes [provider]  montelukast (SINGULAIR) 10 MG tablet Take 10 mg by mouth daily.    Yes [provider]  oxyCODONE-acetaminophen (PERCOCET/ROXICET) 5-325 MG tablet Take 1 tablet by mouth every 4 (four) hours as needed. 02/14/17  Yes IdolAlmyra Free, PA-C  Potassium 99 MG TABS Take by mouth.   Yes [provider]  Probiotic Product (PROBIOTIC DAILY PO) Take 1 tablet by mouth  at bedtime.   Yes [provider]  albuterol (PROVENTIL HFA;VENTOLIN HFA) 108 (90 BASE) MCG/ACT inhaler Inhale 2 puffs into the lungs every 6 (six) hours as needed for wheezing or shortness of breath.    [provider]  budesonide (PULMICORT) 0.5 MG/2ML nebulizer solution Take 0.5 mg by nebulization daily as needed. Reported on 09/09/2015    [provider]  budesonide-formoterol (SYMBICORT) 160-4.5 MCG/ACT inhaler Inhale 2 puffs into the lungs 2 (two) times daily as needed.      [provider]  fluticasone (FLONASE) 50 MCG/ACT nasal spray Place 1 spray into both nostrils daily as needed for allergies or rhinitis.    [provider]  furosemide (LASIX) 40 MG tablet Take 40 mg by mouth daily as needed for edema.    [provider]     Family History  Problem Relation Age of Onset  . Stroke Maternal Grandmother   . Heart disease Paternal Grandmother   . Heart disease Maternal Grandfather   . Cancer Maternal Grandfather   . Alzheimer's disease Maternal Grandfather   . Heart disease Paternal Grandfather     Social History   Socioeconomic History  . Marital status: Married    Spouse name: None  . Number of children: None  . Years of education: None  . Highest education level: None  Social Needs  . Financial resource strain: None  . Food insecurity - worry: None  . Food insecurity - inability: None  . Transportation needs - medical: None  . Transportation needs - non-medical: None  Occupational History  . None  Tobacco Use  . Smoking status: Never Smoker  . Smokeless tobacco: Never Used  Substance and Sexual Activity  . Alcohol use: No    Comment: occ  . Drug use: No  . Sexual activity: Yes  Other Topics Concern  . None  Social History Narrative  . None    Review of Systems: A 12 point ROS discussed and pertinent positives are indicated in the HPI above.  All other systems are negative.  Review of Systems  Constitutional: Positive for activity change. Negative for fatigue and fever.  Respiratory: Negative for cough and shortness of breath.   Cardiovascular: Negative for chest pain.  Gastrointestinal: Negative for abdominal pain.  Musculoskeletal: Positive for back pain and gait problem.  Neurological: Negative for weakness.  Psychiatric/Behavioral: Negative for behavioral problems and confusion.    Vital Signs: BP 123/83   Pulse 72   Temp 98 F (36.7 C) (Oral)   Resp 18   Ht 5\' 5"  (1.651 m)   Wt 296 lb  (134.3 kg)   SpO2 98%   BMI 49.26 kg/m   Physical Exam  Constitutional: She is oriented to person, place, and time.  Cardiovascular: Normal rate, regular rhythm and normal heart sounds.  Pulmonary/Chest: Effort normal and breath sounds normal.  Abdominal: Soft. Bowel sounds are normal.  Musculoskeletal: Normal range of motion. She exhibits tenderness.  Low back pain  Neurological: She is alert and oriented to person, place, and time.  Skin: Skin is warm and dry.  Psychiatric: She has a normal mood and affect. Her behavior is normal. Judgment and thought content normal.  Nursing note and vitals reviewed.   Imaging: Ct Lumbar Spine Wo Contrast  Result Date: 02/14/2017 CLINICAL DATA:  Patient fell on ice December 12, back pain since that time. EXAM: CT LUMBAR SPINE WITHOUT CONTRAST TECHNIQUE: Multidetector CT imaging of the lumbar spine was performed without intravenous contrast administration.  Multiplanar CT image reconstructions were also generated. COMPARISON:  CT abdomen pelvis 18 2016. FINDINGS: Segmentation: 5 lumbar type vertebrae. Alignment: There is 7 mm anterolisthesis L4-5 which is related to advanced facet arthropathy. No definite pars defects. Vertebrae: There is acute compression fracture of L2, mild anterior wedging. There is depression of the LEFT hemivertebra superiorly, without involvement of the pedicles or post elements. No retropulsion. There is chronic sclerosis at T12-L1 related disc space narrowing. No other fractures are seen. Paraspinal and other soft tissues: Aortic atherosclerosis. Disc levels: L1-L2:  Unremarkable.  No significant retropulsion. L2-L3:  Unremarkable. L3-L4:  Unremarkable. L4-L5: 7 mm anterolisthesis. Disc uncovering with facet arthropathy and ligamentum flavum hypertrophy contributes to severe stenosis. BILATERAL L4 and L5 nerve root impingement are likely. L5-S1: Good disc height is observed. There is advanced facet arthropathy. No definite disc  protrusion or stenosis. IMPRESSION: Acute L2 posttraumatic/osteopenic vertebral body compression fracture, mild anterior wedging without retropulsion. No involvement of posterior elements. Severe stenosis at L4-5 secondary to stenosis, posterior element hypertrophy, and disc uncovering. Electronically Signed   By: Staci Righter M.D.   On: 02/14/2017 17:53   Mr Lumbar Spine Wo Contrast  Result Date: 02/21/2017 CLINICAL DATA:  57 y/o  F; lower back pain after falling on ice. EXAM: MRI LUMBAR SPINE WITHOUT CONTRAST TECHNIQUE: Multiplanar, multisequence MR imaging of the lumbar spine was performed. No intravenous contrast was administered. COMPARISON:  02/14/2017 lumbar spine CT. 12/17/2009 lumbar spine MRI. FINDINGS: Segmentation:  Standard. Alignment:  Grade 1 L4-5 anterolisthesis. Vertebrae: L2 mild anterior compression deformity with edema indicating recent injury. No additional acute fracture identified. Bilateral L1-L4 small facet effusions. Conus medullaris and cauda equina: Conus extends to the L1 level. Conus and cauda equina appear normal. Paraspinal and other soft tissues: Negative. Disc levels: L1-2: Small disc bulge. No significant foraminal or canal stenosis. L2-3: Small disc bulge. No significant foraminal or canal stenosis. L3-4: No significant disc displacement, foraminal stenosis, or canal stenosis. L4-5: Progressed grade 1 anterolisthesis, uncovered disc bulge, and severe facet hypertrophy. Mild bilateral foraminal stenosis. Moderate canal stenosis. L5-S1: No significant disc displacement, foraminal stenosis, or canal stenosis. Moderate facet hypertrophy. IMPRESSION: 1. L2 mild anterior compression deformity with edema indicating recent injury. No additional acute osseous abnormality identified. 2. Progression of lumbar spondylosis with increased L4-5 anterolisthesis and lower lumbar facet arthropathy. 3. Multifactorial moderate L4-5 canal stenosis. 4. Mild bilateral L4-5 foraminal stenosis.  Electronically Signed   By: Kristine Garbe M.D.   On: 02/21/2017 05:09   Ir Radiologist Eval & Mgmt  Result Date: 03/08/2017 EXAM: NEW PATIENT OFFICE VISIT CHIEF COMPLAINT: The patient is a 57 year old right-handed lady who has been referred for evaluation and management of severe pain secondary to compression fracture at L2. Current Pain Level: 1-10 HISTORY OF PRESENT ILLNESS: The patient reports the onset of severe pain in the lower back region following a fall on December 12th. The pain was almost immediate excruciating. Since that time, the pain has somewhat receded though continues to be an 8 to 9/10 when the patient is standing for long periods of time. Sitting down somewhat alleviates the pain. There is no radiation of pain in a radicular manner. There is no autonomic dysfunction of her bowel or bladder activities. The patient denies any recent chills, fever or rigors. She denies any dysuria, hematuria or polyuria The patient ambulates with difficulty without aid. 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 Her pain is significantly worsens toward the end of the  day. She is able to drive again though with some difficulty. Her appetite is normal. Her weight is on a downward trend. The patient is actively trying to reduce weight to prepare herself for bilateral knee arthroplasties. Past Medical History: Arthrirts, chronic low back pain, degenerative joint disease, hypertension, asthma and acid reflux. Medications: Albuterol inhaler 2 puffs every 6 hours as needed. Atenolol. Pepto-Bismol. Pulmicort nebulizer as needed. Symbicort inhaler as needed. Zyrtec. Temovate ointment for topical application. Cymbalta. Epinephrine 0.3 mg 0.3 mL injection as needed. Furosemide intermittently. Hydrocodone acetaminophen 5/325 one tablet every 6 hours. The patient takes only 1 tablet at bedtime. Lisinopril. Singulair tablet. Percocet as needed. Probiotics tablet Allergies: Duramorph, sulfa  antibiotics, triamterene and latex. Patient also has gastric ulcerations with NSAIDs. Social History: Married, one stepdaughter alive and well. She is a Copywriter, advertising working. Has 5 drinks per year. Denies smoking cigarettes or using illicit chemicals. Is on a special diet to lose weight. Family History:  Asthma, otherwise, negative. REVIEW OF SYSTEMS: Negative unless as mentioned above. PHYSICAL EXAMINATION: Alert, awake in mild-to-moderate distress on account of her pain. The patient is exquisitely tender in the midline at L2 region on palpation. Neurologically grossly intact. ASSESSMENT AND PLAN: The patient's recent MRI scan of the lumbar spine was reviewed with her. Brought to her attention was the compression fracture at L2 with loss of approximately 30% height anteriorly. No significant evidence of retropulsion. Also demonstrated was multilevel degenerative disc disease. The option of vertebral body augmentation with balloon kyphoplasty to prevent further collapse, and to alleviate pain was discussed. The procedure, benefits and alternatives were all reviewed. The patient expressed her knowledge of the procedure. The patient would like to proceed with the procedure at L2. This will be scheduled as soon as possible. In the meantime, the patient was advised to refrain from stooping, bending or lifting anything above 10 pounds, and to use either a cane or a walker as needed. Questions were answered to her satisfaction. The patient leaves with good understanding and agreement with the above management plan. Electronically Signed   By: Luanne Bras M.D.   On: 03/04/2017 14:05    Labs:  CBC: No results for input(s): WBC, HGB, HCT, PLT in the last 8760 hours.  COAGS: No results for input(s): INR, APTT in the last 8760 hours.  BMP: No results for input(s): NA, K, CL, CO2, GLUCOSE, BUN, CALCIUM, CREATININE, GFRNONAA, GFRAA in the last 8760 hours.  Invalid input(s): CMP  LIVER FUNCTION  TESTS: No results for input(s): BILITOT, AST, ALT, ALKPHOS, PROT, ALBUMIN in the last 8760 hours.  TUMOR MARKERS: No results for input(s): AFPTM, CEA, CA199, CHROMGRNA in the last 8760 hours.  Assessment and Plan:  Worsening low back pain Acute Lumbar 2 painful fx per MRI Scheduled now for L2 Kyphoplasty Risks and benefits of Lumbar 2 kyphoplasty were discussed with the patient including, but not limited to education regarding the natural healing process of compression fractures without intervention, bleeding, infection, cement migration which may cause spinal cord damage, paralysis, pulmonary embolism or even death. This interventional procedure involves the use of X-rays and because of the nature of the planned procedure, it is possible that we will have prolonged use of X-ray fluoroscopy. Potential radiation risks to you include (but are not limited to) the following: - A slightly elevated risk for cancer  several years later in life. This risk is typically less than 0.5% percent. This risk is low in comparison to the normal incidence of human cancer,  which is 33% for women and 50% for men according to the [place]. - Radiation induced injury can include skin redness, resembling a rash, tissue breakdown / ulcers and hair loss (which can be temporary or permanent).  The likelihood of either of these occurring depends on the difficulty of the procedure and whether you are sensitive to radiation due to previous procedures, disease, or genetic conditions.  IF your procedure requires a prolonged use of radiation, you will be notified and given written instructions for further action.  It is your responsibility to monitor the irradiated area for the 2 weeks following the procedure and to notify your physician if you are concerned that you have suffered a radiation induced injury.    All of the patient's questions were answered, patient is agreeable to proceed. Consent signed and in  chart.  Thank you for this interesting consult.  I greatly enjoyed meeting Shawntrice Salle Gayden and look forward to participating in their care.  A copy of this report was sent to the requesting provider on this date.  Electronically Signed: Lavonia Drafts, PA-C 03/12/2017, 7:39 AM   I spent a total of  30 Minutes   in face to face in clinical consultation, greater than 50% of which was counseling/coordinating care for L2 KP

## 2017-03-15 ENCOUNTER — Encounter (HOSPITAL_COMMUNITY): Payer: Self-pay | Admitting: Interventional Radiology

## 2017-03-17 ENCOUNTER — Telehealth (HOSPITAL_COMMUNITY): Payer: Self-pay

## 2017-03-17 NOTE — Telephone Encounter (Signed)
Called to schedule f/u, left message for pt to return call. AW 

## 2017-09-06 ENCOUNTER — Other Ambulatory Visit: Payer: Self-pay | Admitting: Internal Medicine

## 2017-09-06 DIAGNOSIS — M545 Low back pain, unspecified: Secondary | ICD-10-CM

## 2017-09-11 ENCOUNTER — Ambulatory Visit
Admission: RE | Admit: 2017-09-11 | Discharge: 2017-09-11 | Disposition: A | Discharge status: Home / Self Care | Payer: PRIVATE HEALTH INSURANCE | Source: Ambulatory Visit | Attending: Internal Medicine | Admitting: Internal Medicine

## 2017-09-11 DIAGNOSIS — M545 Low back pain, unspecified: Secondary | ICD-10-CM

## 2018-01-11 ENCOUNTER — Other Ambulatory Visit: Payer: Self-pay | Admitting: Obstetrics & Gynecology

## 2018-01-11 DIAGNOSIS — Z1231 Encounter for screening mammogram for malignant neoplasm of breast: Secondary | ICD-10-CM

## 2018-02-24 DIAGNOSIS — Z09 Encounter for follow-up examination after completed treatment for conditions other than malignant neoplasm: Secondary | ICD-10-CM | POA: Diagnosis not present

## 2018-02-24 DIAGNOSIS — M4316 Spondylolisthesis, lumbar region: Secondary | ICD-10-CM | POA: Diagnosis not present

## 2018-02-25 ENCOUNTER — Ambulatory Visit
Admission: RE | Admit: 2018-02-25 | Discharge: 2018-02-25 | Disposition: A | Discharge status: Home / Self Care | Payer: BLUE CROSS/BLUE SHIELD | Source: Ambulatory Visit | Attending: Obstetrics & Gynecology | Admitting: Obstetrics & Gynecology

## 2018-02-25 DIAGNOSIS — Z1231 Encounter for screening mammogram for malignant neoplasm of breast: Secondary | ICD-10-CM | POA: Diagnosis not present

## 2018-05-13 DIAGNOSIS — R002 Palpitations: Secondary | ICD-10-CM | POA: Diagnosis not present

## 2018-05-13 DIAGNOSIS — M4800 Spinal stenosis, site unspecified: Secondary | ICD-10-CM | POA: Diagnosis not present

## 2018-05-13 DIAGNOSIS — J4520 Mild intermittent asthma, uncomplicated: Secondary | ICD-10-CM | POA: Diagnosis not present

## 2018-05-13 DIAGNOSIS — I10 Essential (primary) hypertension: Secondary | ICD-10-CM | POA: Diagnosis not present

## 2018-05-18 DIAGNOSIS — M4316 Spondylolisthesis, lumbar region: Secondary | ICD-10-CM | POA: Diagnosis not present

## 2018-05-18 DIAGNOSIS — M48062 Spinal stenosis, lumbar region with neurogenic claudication: Secondary | ICD-10-CM | POA: Diagnosis not present

## 2018-07-01 DIAGNOSIS — R197 Diarrhea, unspecified: Secondary | ICD-10-CM | POA: Diagnosis not present

## 2018-07-01 DIAGNOSIS — K582 Mixed irritable bowel syndrome: Secondary | ICD-10-CM | POA: Diagnosis not present

## 2018-07-01 DIAGNOSIS — G894 Chronic pain syndrome: Secondary | ICD-10-CM | POA: Diagnosis not present

## 2018-07-01 DIAGNOSIS — K921 Melena: Secondary | ICD-10-CM | POA: Diagnosis not present

## 2018-07-07 ENCOUNTER — Other Ambulatory Visit: Payer: Self-pay

## 2018-07-07 ENCOUNTER — Ambulatory Visit (INDEPENDENT_AMBULATORY_CARE_PROVIDER_SITE_OTHER): Payer: BC Managed Care – PPO | Admitting: Internal Medicine

## 2018-07-07 ENCOUNTER — Encounter (INDEPENDENT_AMBULATORY_CARE_PROVIDER_SITE_OTHER): Payer: Self-pay | Admitting: Internal Medicine

## 2018-07-07 VITALS — BP 129/71 | HR 85 | Temp 98.1°F | Ht 64.0 in | Wt 346.8 lb

## 2018-07-07 DIAGNOSIS — R195 Other fecal abnormalities: Secondary | ICD-10-CM

## 2018-07-07 DIAGNOSIS — K625 Hemorrhage of anus and rectum: Secondary | ICD-10-CM

## 2018-07-07 NOTE — Progress Notes (Signed)
Subjective:    Patient ID: Heather Delgado, female    DOB: 10/06/1960, 58 y.o.   MRN: [id number]  HPI Referred by Dr. Nevada Crane for constipation/diarrhea, rectal bleeding. She tells me she was taking Diclofenac (2 tabs a day) and she started having some rectal bleeding. Had been taking the Diclofenac since March. Stopped one week ago. She had BRRB. Rectal bleeding has now resolved. She also had diarrhea which has now resolved since stopping the diclofenac. She is much better. Stools are soft and ribbony. No blood now. No melena.  Her appetite is good. No weight loss.   Last colonoscopy in 2008 (diverticulitis, diarrhea).   1. Few scattered diverticula at sigmoid and descending colon.  2. No evidence of colitis or ileitis.  3. Evidence of colitis or terminal ileitis. Biopsy:negative.   Hx of ulcer in the past Review of Systems Past Medical History:  Diagnosis Date  . Arthritis   . Asthma   . Back pain   . Diverticulitis   . DJD (degenerative joint disease)   . Hypertension   . Reflux   . Spondylolysis     Past Surgical History:  Procedure Laterality Date  . CARPAL TUNNEL RELEASE    . CESAREAN SECTION    . CHOLECYSTECTOMY    . FINGER SURGERY    . I&D EXTREMITY  12/30/2011   Procedure: IRRIGATION AND DEBRIDEMENT EXTREMITY;  Surgeon: Roseanne Kaufman, MD;  Location: [place];  Service: Orthopedics;  Laterality: Right;  1st finger  . INFECTED SKIN DEBRIDEMENT  12/29/2011   RIGHT INDEX   . IR RADIOLOGIST EVAL & MGMT  03/04/2017  . IR VERTEBROPLASTY LUMBAR BX INC UNI/BIL INC/INJECT/IMAGING  03/12/2017  . KNEE SURGERY      Allergies  Allergen Reactions  . Duramorph [Morphine] Rash    "full body rash"  . Sulfa Antibiotics Anaphylaxis and Rash  . Triamterene Anaphylaxis and Rash  . Other Other (See Comments)    Medications that have thimersol in them cause the patient to develop blisters.   . Latex Rash    Current Outpatient Medications on File Prior to Visit  Medication Sig Dispense  Refill  . albuterol (PROVENTIL HFA;VENTOLIN HFA) 108 (90 BASE) MCG/ACT inhaler Inhale 2 puffs into the lungs every 6 (six) hours as needed for wheezing or shortness of breath.    Marland Kitchen atenolol (TENORMIN) 25 MG tablet Take 25 mg by mouth every evening. For palpitations    . budesonide (PULMICORT) 0.5 MG/2ML nebulizer solution Take 0.5 mg by nebulization daily as needed. Reported on 09/09/2015    . budesonide-formoterol (SYMBICORT) 160-4.5 MCG/ACT inhaler Inhale 2 puffs into the lungs 2 (two) times daily as needed.     Marland Kitchen buPROPion (WELLBUTRIN XL) 150 MG 24 hr tablet Take 150 mg by mouth daily.    . cetirizine (ZYRTEC) 10 MG tablet Take 10 mg by mouth daily.    . clobetasol ointment (TEMOVATE) 5.85 % Apply 1 application topically daily as needed. Likens Disease    . DULoxetine (CYMBALTA) 60 MG capsule Take 60 mg by mouth every morning.     Marland Kitchen EPINEPHrine (EPIPEN 2-PAK) 0.3 mg/0.3 mL IJ SOAJ injection Inject 0.3 mg into the muscle daily as needed.     . fluticasone (FLONASE) 50 MCG/ACT nasal spray Place 1 spray into both nostrils daily as needed for allergies or rhinitis.    . furosemide (LASIX) 40 MG tablet Take 40 mg by mouth daily as needed for edema.    Marland Kitchen lisinopril (PRINIVIL,ZESTRIL) 20 MG  tablet Take 20 mg by mouth at bedtime.    . Magnesium Oxide (MAG-200) 200 MG TABS Take by mouth.    . montelukast (SINGULAIR) 10 MG tablet Take 10 mg by mouth daily.     Marland Kitchen oxyCODONE-acetaminophen (PERCOCET/ROXICET) 5-325 MG tablet Take 1 tablet by mouth every 4 (four) hours as needed. 20 tablet 0  . Probiotic Product (PROBIOTIC DAILY PO) Take 1 tablet by mouth at bedtime.    . traMADol (ULTRAM) 50 MG tablet Take by mouth every 6 (six) hours as needed.     No current facility-administered medications on file prior to visit.         Objective:   Physical Exam Blood pressure 129/71, pulse 85, temperature 98.1 F (36.7 C), height 5\' 4"  (1.626 m), weight (!) 346 lb 12.8 oz (157.3 kg). Alert and oriented. Skin  warm and dry. Oral mucosa is moist.   . Sclera anicteric, conjunctivae is pink. Thyroid not enlarged. No cervical lymphadenopathy. Lungs clear. Heart regular rate and rhythm.  Abdomen is soft. Bowel sounds are positive. No hepatomegaly. No abdominal masses felt. No tenderness.  1+ edema to lower extremities.  Walks with a cane.         Assessment & Plan:  Rectal bleeding, diarrhea, resolved since stopping the Diclofenac. Colonic neoplasm, ulcer needs to be ruled.  Still has a change in her stools. She is overdue for a colonoscopy.

## 2018-07-07 NOTE — Patient Instructions (Signed)
The risks of bleeding, perforation and infection were reviewed with patient.  

## 2018-08-04 ENCOUNTER — Telehealth (INDEPENDENT_AMBULATORY_CARE_PROVIDER_SITE_OTHER): Payer: Self-pay | Admitting: *Deleted

## 2018-08-04 NOTE — Telephone Encounter (Signed)
Left several messages for patient to call me to schedule TCS -- no return call as yet

## 2018-08-04 NOTE — Telephone Encounter (Signed)
noted 

## 2018-08-12 DIAGNOSIS — R7301 Impaired fasting glucose: Secondary | ICD-10-CM | POA: Diagnosis not present

## 2018-08-12 DIAGNOSIS — I10 Essential (primary) hypertension: Secondary | ICD-10-CM | POA: Diagnosis not present

## 2018-08-12 DIAGNOSIS — E782 Mixed hyperlipidemia: Secondary | ICD-10-CM | POA: Diagnosis not present

## 2018-08-12 DIAGNOSIS — R7303 Prediabetes: Secondary | ICD-10-CM | POA: Diagnosis not present

## 2018-08-16 ENCOUNTER — Telehealth (INDEPENDENT_AMBULATORY_CARE_PROVIDER_SITE_OTHER): Payer: Self-pay | Admitting: *Deleted

## 2018-08-16 ENCOUNTER — Encounter (INDEPENDENT_AMBULATORY_CARE_PROVIDER_SITE_OTHER): Payer: Self-pay | Admitting: *Deleted

## 2018-08-16 ENCOUNTER — Other Ambulatory Visit (INDEPENDENT_AMBULATORY_CARE_PROVIDER_SITE_OTHER): Payer: Self-pay | Admitting: Internal Medicine

## 2018-08-16 DIAGNOSIS — R195 Other fecal abnormalities: Secondary | ICD-10-CM

## 2018-08-16 DIAGNOSIS — K625 Hemorrhage of anus and rectum: Secondary | ICD-10-CM | POA: Insufficient documentation

## 2018-08-16 MED ORDER — PEG 3350-KCL-NA BICARB-NACL 420 G PO SOLR: 4000.0000 mL | Freq: Once | ORAL | 0 refills | Status: AC

## 2018-08-16 NOTE — Telephone Encounter (Signed)
Patient needs trilyte 

## 2018-08-19 DIAGNOSIS — E782 Mixed hyperlipidemia: Secondary | ICD-10-CM | POA: Diagnosis not present

## 2018-08-19 DIAGNOSIS — R7303 Prediabetes: Secondary | ICD-10-CM | POA: Diagnosis not present

## 2018-08-19 DIAGNOSIS — I10 Essential (primary) hypertension: Secondary | ICD-10-CM | POA: Diagnosis not present

## 2018-08-19 DIAGNOSIS — M170 Bilateral primary osteoarthritis of knee: Secondary | ICD-10-CM | POA: Diagnosis not present

## 2018-08-22 ENCOUNTER — Other Ambulatory Visit (HOSPITAL_COMMUNITY)
Admission: RE | Admit: 2018-08-22 | Discharge: 2018-08-22 | Disposition: A | Discharge status: Home / Self Care | Payer: BC Managed Care – PPO | Source: Ambulatory Visit | Attending: Internal Medicine | Admitting: Internal Medicine

## 2018-08-22 DIAGNOSIS — Z01812 Encounter for preprocedural laboratory examination: Secondary | ICD-10-CM | POA: Diagnosis not present

## 2018-08-22 DIAGNOSIS — Z1159 Encounter for screening for other viral diseases: Secondary | ICD-10-CM | POA: Insufficient documentation

## 2018-08-22 DIAGNOSIS — K625 Hemorrhage of anus and rectum: Secondary | ICD-10-CM

## 2018-08-22 DIAGNOSIS — R195 Other fecal abnormalities: Secondary | ICD-10-CM

## 2018-08-23 LAB — NOVEL CORONAVIRUS, NAA (HOSP ORDER, SEND-OUT TO REF LAB; TAT 18-24 HRS): SARS-CoV-2, NAA: NOT DETECTED

## 2018-08-25 ENCOUNTER — Ambulatory Visit (HOSPITAL_COMMUNITY)
Admission: RE | Admit: 2018-08-25 | Discharge: 2018-08-25 | Disposition: A | Discharge status: Home / Self Care | Payer: BC Managed Care – PPO | Attending: Internal Medicine | Admitting: Internal Medicine

## 2018-08-25 ENCOUNTER — Encounter (HOSPITAL_COMMUNITY)
Admission: RE | Disposition: A | Discharge status: Home / Self Care | Payer: Self-pay | Source: Home / Self Care | Attending: Internal Medicine

## 2018-08-25 ENCOUNTER — Encounter (HOSPITAL_COMMUNITY): Payer: Self-pay

## 2018-08-25 ENCOUNTER — Other Ambulatory Visit: Payer: Self-pay

## 2018-08-25 DIAGNOSIS — K621 Rectal polyp: Secondary | ICD-10-CM | POA: Insufficient documentation

## 2018-08-25 DIAGNOSIS — K219 Gastro-esophageal reflux disease without esophagitis: Secondary | ICD-10-CM | POA: Diagnosis not present

## 2018-08-25 DIAGNOSIS — Z885 Allergy status to narcotic agent status: Secondary | ICD-10-CM | POA: Diagnosis not present

## 2018-08-25 DIAGNOSIS — R195 Other fecal abnormalities: Secondary | ICD-10-CM

## 2018-08-25 DIAGNOSIS — Z79899 Other long term (current) drug therapy: Secondary | ICD-10-CM | POA: Insufficient documentation

## 2018-08-25 DIAGNOSIS — M1990 Unspecified osteoarthritis, unspecified site: Secondary | ICD-10-CM | POA: Insufficient documentation

## 2018-08-25 DIAGNOSIS — K5730 Diverticulosis of large intestine without perforation or abscess without bleeding: Secondary | ICD-10-CM | POA: Diagnosis not present

## 2018-08-25 DIAGNOSIS — D123 Benign neoplasm of transverse colon: Secondary | ICD-10-CM | POA: Insufficient documentation

## 2018-08-25 DIAGNOSIS — I10 Essential (primary) hypertension: Secondary | ICD-10-CM | POA: Insufficient documentation

## 2018-08-25 DIAGNOSIS — J45909 Unspecified asthma, uncomplicated: Secondary | ICD-10-CM | POA: Insufficient documentation

## 2018-08-25 DIAGNOSIS — D125 Benign neoplasm of sigmoid colon: Secondary | ICD-10-CM | POA: Insufficient documentation

## 2018-08-25 DIAGNOSIS — Z882 Allergy status to sulfonamides status: Secondary | ICD-10-CM | POA: Insufficient documentation

## 2018-08-25 DIAGNOSIS — Z888 Allergy status to other drugs, medicaments and biological substances status: Secondary | ICD-10-CM | POA: Insufficient documentation

## 2018-08-25 DIAGNOSIS — D120 Benign neoplasm of cecum: Secondary | ICD-10-CM | POA: Diagnosis not present

## 2018-08-25 DIAGNOSIS — K644 Residual hemorrhoidal skin tags: Secondary | ICD-10-CM | POA: Diagnosis not present

## 2018-08-25 DIAGNOSIS — K625 Hemorrhage of anus and rectum: Secondary | ICD-10-CM | POA: Insufficient documentation

## 2018-08-25 HISTORY — PX: COLONOSCOPY: SHX5424

## 2018-08-25 HISTORY — PX: BIOPSY: SHX5522

## 2018-08-25 HISTORY — PX: POLYPECTOMY: SHX5525

## 2018-08-25 SURGERY — COLONOSCOPY
Anesthesia: Moderate Sedation

## 2018-08-25 MED ORDER — MEPERIDINE HCL 50 MG/ML IJ SOLN
INTRAMUSCULAR | Status: AC
  Filled 2018-08-25: qty 1

## 2018-08-25 MED ORDER — SODIUM CHLORIDE 0.9 % IV SOLN
INTRAVENOUS | Status: DC
  Administered 2018-08-25: 10:00:00 via INTRAVENOUS

## 2018-08-25 MED ORDER — MEPERIDINE HCL 50 MG/ML IJ SOLN
INTRAMUSCULAR | Status: DC | PRN
  Administered 2018-08-25 (×3): 25 mg

## 2018-08-25 MED ORDER — MIDAZOLAM HCL 5 MG/5ML IJ SOLN
INTRAMUSCULAR | Status: DC | PRN
  Administered 2018-08-25: 3 mg via INTRAVENOUS
  Administered 2018-08-25: 1 mg via INTRAVENOUS
  Administered 2018-08-25: 2 mg via INTRAVENOUS
  Administered 2018-08-25: 1 mg via INTRAVENOUS
  Administered 2018-08-25: 2 mg via INTRAVENOUS

## 2018-08-25 MED ORDER — MIDAZOLAM HCL 5 MG/5ML IJ SOLN
INTRAMUSCULAR | Status: AC
  Filled 2018-08-25: qty 10

## 2018-08-25 NOTE — OR Nursing (Signed)
Vital signs within normal limits throughout the procedure. No adverse reaction to sedation

## 2018-08-25 NOTE — Op Note (Signed)
[facility] Patient Name: Heather Delgado Procedure Date: 08/25/2018 9:55 AM MRN: [id number] Date of Birth: [birth year] Attending MD: Hildred Laser , MD CSN: [id number] Age: 58 Admit Type: Outpatient Procedure:                Colonoscopy Indications:              Rectal bleeding Providers:                Hildred Laser, MD, Janeece Riggers, RN, Aram Candela Referring MD:             Delphina Cahill, MD Medicines:                Meperidine 75 mg IV, Midazolam 9 mg IV Complications:            No immediate complications. Estimated Blood Loss:     Estimated blood loss was minimal. Procedure:                Pre-Anesthesia Assessment:                           - Prior to the procedure, a History and Physical                            was performed, and patient medications and                            allergies were reviewed. The patient's tolerance of                            previous anesthesia was also reviewed. The risks                            and benefits of the procedure and the sedation                            options and risks were discussed with the patient.                            All questions were answered, and informed consent                            was obtained. Prior Anticoagulants: The patient has                            taken no previous anticoagulant or antiplatelet                            agents. ASA Grade Assessment: III - A patient with                            severe systemic disease. After reviewing the risks                            and benefits, the patient was deemed in  satisfactory condition to undergo the procedure.                           After obtaining informed consent, the colonoscope                            was passed under direct vision. Throughout the                            procedure, the patient's blood pressure, pulse, and                            oxygen saturations were monitored continuously. The                             CF-HQ190L ([id number]) scope was introduced through                            the anus and advanced to the the cecum, identified                            by appendiceal orifice and ileocecal valve. The                            colonoscopy was performed without difficulty. The                            patient tolerated the procedure well. The quality                            of the bowel preparation was adequate. The                            ileocecal valve, appendiceal orifice, and rectum                            were photographed. Scope In: 10:33:56 AM Scope Out: 10:59:36 AM Scope Withdrawal Time: 0 hours 18 minutes 39 seconds  Total Procedure Duration: 0 hours 25 minutes 40 seconds  Findings:      The perianal and digital rectal examinations were normal.      Three sessile polyps were found in the sigmoid colon, splenic flexure       and cecum. The polyps were small in size. These were biopsied with a       cold forceps for histology. The pathology specimen was placed into       Bottle Number 1.      Scattered diverticula were found in the sigmoid colon.      A 4 mm polyp was found in the rectum. The polyp was sessile. The polyp       was removed with a cold snare. Resection and retrieval were complete.       The pathology specimen was placed into Bottle Number 2.      External hemorrhoids were found during retroflexion. The hemorrhoids       were small. Impression:               -  Three small polyps in the sigmoid colon, at the                            splenic flexure and in the cecum. Biopsied.                           - Diverticulosis in the sigmoid colon.                           - One 4 mm polyp in the rectum, removed with a cold                            snare. Resected and retrieved.                           - External hemorrhoids. Moderate Sedation:      Moderate (conscious) sedation was administered by the endoscopy nurse        and supervised by the endoscopist. The following parameters were       monitored: oxygen saturation, heart rate, blood pressure, CO2       capnography and response to care. Total physician intraservice time was       32 minutes. Recommendation:           - Patient has a contact number available for                            emergencies. The signs and symptoms of potential                            delayed complications were discussed with the                            patient. Return to normal activities tomorrow.                            Written discharge instructions were provided to the                            patient.                           - High fiber diet today.                           - Continue present medications.                           - No aspirin, ibuprofen, naproxen, or other                            non-steroidal anti-inflammatory drugs for 1 day.                           - Await pathology results.                           -  Repeat colonoscopy is recommended. The                            colonoscopy date will be determined after pathology                            results from today's exam become available for                            review. Procedure Code(s):        --- Professional ---                           [phone], Colonoscopy, flexible; with removal of                            tumor(s), polyp(s), or other lesion(s) by snare                            technique                           45380, 59, Colonoscopy, flexible; with biopsy,                            single or multiple                           99153, Moderate sedation; each additional 15                            minutes intraservice time                           G0500, Moderate sedation services provided by the                            same physician or other qualified health care                            professional performing a gastrointestinal                             endoscopic service that sedation supports,                            requiring the presence of an independent trained                            observer to assist in the monitoring of the                            patient's level of consciousness and physiological                            status; initial 15 minutes of intra-service time;  patient age 22 years or older (additional time may                            be reported with [phone], as appropriate) Diagnosis Code(s):        --- Professional ---                           K63.5, Polyp of colon                           K64.4, Residual hemorrhoidal skin tags                           K62.1, Rectal polyp                           K62.5, Hemorrhage of anus and rectum                           K57.30, Diverticulosis of large intestine without                            perforation or abscess without bleeding CPT copyright 2019 [employer]. All rights reserved. The codes documented in this report are preliminary and upon coder review may  be revised to meet current compliance requirements. Hildred Laser, MD Hildred Laser, MD 08/25/2018 11:09:20 AM This report has been signed electronically. Number of Addenda: 0

## 2018-08-25 NOTE — Discharge Instructions (Signed)
No aspirin or NSAIDs for 24 hours. Resume usual medications as before. High-fiber diet. No driving for 24 hours. Physician will call with biopsy results.   Colonoscopy, Adult, Care After This sheet gives you information about how to care for yourself after your procedure. Your doctor may also give you more specific instructions. If you have problems or questions, call your doctor. What can I expect after the procedure? After the procedure, it is common to have:  A small amount of blood in your poop for 24 hours.  Some gas.  Mild cramping or bloating in your belly. Follow these instructions at home: General instructions  For the first 24 hours after the procedure: ? Do not drive or use machinery. ? Do not sign important documents. ? Do not drink alcohol. ? Do your daily activities more slowly than normal. ? Eat foods that are soft and easy to digest.  Take over-the-counter or prescription medicines only as told by your doctor. To help cramping and bloating:   Try walking around.  Put heat on your belly (abdomen) as told by your doctor. Use a heat source that your doctor recommends, such as a moist heat pack or a heating pad. ? Put a towel between your skin and the heat source. ? Leave the heat on for 20-30 minutes. ? Remove the heat if your skin turns bright red. This is especially important if you cannot feel pain, heat, or cold. You can get burned. Eating and drinking   Drink enough fluid to keep your pee (urine) clear or pale yellow.  Return to your normal diet as told by your doctor. Avoid heavy or fried foods that are hard to digest.  Avoid drinking alcohol for as long as told by your doctor. Contact a doctor if:  You have blood in your poop (stool) 2-3 days after the procedure. Get help right away if:  You have more than a small amount of blood in your poop.  You see large clumps of tissue (blood clots) in your poop.  Your belly is swollen.  You feel sick  to your stomach (nauseous).  You throw up (vomit).  You have a fever.  You have belly pain that gets worse, and medicine does not help your pain. Summary  After the procedure, it is common to have a small amount of blood in your poop. You may also have mild cramping and bloating in your belly.  For the first 24 hours after the procedure, do not drive or use machinery, do not sign important documents, and do not drink alcohol.  Get help right away if you have a lot of blood in your poop, feel sick to your stomach, have a fever, or have more belly pain. This information is not intended to replace advice given to you by your health care provider. Make sure you discuss any questions you have with your health care provider. Document Released: 03/14/2010 Document Revised: 12/10/2016 Document Reviewed: 11/04/2015 Elsevier Patient Education  2020 [employer].  Hemorrhoids Hemorrhoids are swollen veins in and around the rectum or anus. There are two types of hemorrhoids:  Internal hemorrhoids. These occur in the veins that are just inside the rectum. They may poke through to the outside and become irritated and painful.  External hemorrhoids. These occur in the veins that are outside the anus and can be felt as a painful swelling or hard lump near the anus. Most hemorrhoids do not cause serious problems, and they can be managed with home  treatments such as diet and lifestyle changes. If home treatments do not help the symptoms, procedures can be done to shrink or remove the hemorrhoids. What are the causes? This condition is caused by increased pressure in the anal area. This pressure may result from various things, including:  Constipation.  Straining to have a bowel movement.  Diarrhea.  Pregnancy.  Obesity.  Sitting for long periods of time.  Heavy lifting or other activity that causes you to strain.  Anal sex.  Riding a bike for a long period of time. What are the signs or  symptoms? Symptoms of this condition include:  Pain.  Anal itching or irritation.  Rectal bleeding.  Leakage of stool (feces).  Anal swelling.  One or more lumps around the anus. How is this diagnosed? This condition can often be diagnosed through a visual exam. Other exams or tests may also be done, such as:  An exam that involves feeling the rectal area with a gloved hand (digital rectal exam).  An exam of the anal canal that is done using a small tube (anoscope).  A blood test, if you have lost a significant amount of blood.  A test to look inside the colon using a flexible tube with a camera on the end (sigmoidoscopy or colonoscopy). How is this treated? This condition can usually be treated at home. However, various procedures may be done if dietary changes, lifestyle changes, and other home treatments do not help your symptoms. These procedures can help make the hemorrhoids smaller or remove them completely. Some of these procedures involve surgery, and others do not. Common procedures include:  Rubber band ligation. Rubber bands are placed at the base of the hemorrhoids to cut off their blood supply.  Sclerotherapy. Medicine is injected into the hemorrhoids to shrink them.  Infrared coagulation. A type of light energy is used to get rid of the hemorrhoids.  Hemorrhoidectomy surgery. The hemorrhoids are surgically removed, and the veins that supply them are tied off.  Stapled hemorrhoidopexy surgery. The surgeon staples the base of the hemorrhoid to the rectal wall. Follow these instructions at home: Eating and drinking   Eat foods that have a lot of fiber in them, such as whole grains, beans, nuts, fruits, and vegetables.  Ask your health care provider about taking products that have added fiber (fiber supplements).  Reduce the amount of fat in your diet. You can do this by eating low-fat dairy products, eating less red meat, and avoiding processed foods.  Drink  enough fluid to keep your urine pale yellow. Managing pain and swelling   Take warm sitz baths for 20 minutes, 3-4 times a day to ease pain and discomfort. You may do this in a bathtub or using a portable sitz bath that fits over the toilet.  If directed, apply ice to the affected area. Using ice packs between sitz baths may be helpful. ? Put ice in a plastic bag. ? Place a towel between your skin and the bag. ? Leave the ice on for 20 minutes, 2-3 times a day. General instructions  Take over-the-counter and prescription medicines only as told by your health care provider.  Use medicated creams or suppositories as told.  Get regular exercise. Ask your health care provider how much and what kind of exercise is best for you. In general, you should do moderate exercise for at least 30 minutes on most days of the week (150 minutes each week). This can include activities such as walking, biking,  or yoga.  Go to the bathroom when you have the urge to have a bowel movement. Do not wait.  Avoid straining to have bowel movements.  Keep the anal area dry and clean. Use wet toilet paper or moist towelettes after a bowel movement.  Do not sit on the toilet for long periods of time. This increases blood pooling and pain.  Keep all follow-up visits as told by your health care provider. This is important. Contact a health care provider if you have:  Increasing pain and swelling that are not controlled by treatment or medicine.  Difficulty having a bowel movement, or you are unable to have a bowel movement.  Pain or inflammation outside the area of the hemorrhoids. Get help right away if you have:  Uncontrolled bleeding from your rectum. Summary  Hemorrhoids are swollen veins in and around the rectum or anus.  Most hemorrhoids can be managed with home treatments such as diet and lifestyle changes.  Taking warm sitz baths can help ease pain and discomfort.  In severe cases, procedures or  surgery can be done to shrink or remove the hemorrhoids. This information is not intended to replace advice given to you by your health care provider. Make sure you discuss any questions you have with your health care provider. Document Released: 02/07/2000 Document Revised: 02/17/2018 Document Reviewed: 07/01/2017 Elsevier Patient Education  2020 [employer].  Diverticulosis  Diverticulosis is a condition that develops when small pouches (diverticula) form in the wall of the large intestine (colon). The colon is where water is absorbed and stool is formed. The pouches form when the inside layer of the colon pushes through weak spots in the outer layers of the colon. You may have a few pouches or many of them. What are the causes? The cause of this condition is not known. What increases the risk? The following factors may make you more likely to develop this condition:  Being older than age 67. Your risk for this condition increases with age. Diverticulosis is rare among people younger than age 70. By age 59, many people have it.  Eating a low-fiber diet.  Having frequent constipation.  Being overweight.  Not getting enough exercise.  Smoking.  Taking over-the-counter pain medicines, like aspirin and ibuprofen.  Having a family history of diverticulosis. What are the signs or symptoms? In most people, there are no symptoms of this condition. If you do have symptoms, they may include:  Bloating.  Cramps in the abdomen.  Constipation or diarrhea.  Pain in the lower left side of the abdomen. How is this diagnosed? This condition is most often diagnosed during an exam for other colon problems. Because diverticulosis usually has no symptoms, it often cannot be diagnosed independently. This condition may be diagnosed by:  Using a flexible scope to examine the colon (colonoscopy).  Taking an X-ray of the colon after dye has been put into the colon (barium enema).  Doing a CT  scan. How is this treated? You may not need treatment for this condition if you have never developed an infection related to diverticulosis. If you have had an infection before, treatment may include:  Eating a high-fiber diet. This may include eating more fruits, vegetables, and grains.  Taking a fiber supplement.  Taking a live bacteria supplement (probiotic).  Taking medicine to relax your colon.  Taking antibiotic medicines. Follow these instructions at home:  Drink 6-8 glasses of water or more each day to prevent constipation.  Try not to  strain when you have a bowel movement.  If you have had an infection before: ? Eat more fiber as directed by your health care provider or your diet and nutrition specialist (dietitian). ? Take a fiber supplement or probiotic, if your health care provider approves.  Take over-the-counter and prescription medicines only as told by your health care provider.  If you were prescribed an antibiotic, take it as told by your health care provider. Do not stop taking the antibiotic even if you start to feel better.  Keep all follow-up visits as told by your health care provider. This is important. Contact a health care provider if:  You have pain in your abdomen.  You have bloating.  You have cramps.  You have not had a bowel movement in 3 days. Get help right away if:  Your pain gets worse.  Your bloating becomes very bad.  You have a fever or chills, and your symptoms suddenly get worse.  You vomit.  You have bowel movements that are bloody or black.  You have bleeding from your rectum. Summary  Diverticulosis is a condition that develops when small pouches (diverticula) form in the wall of the large intestine (colon).  You may have a few pouches or many of them.  This condition is most often diagnosed during an exam for other colon problems.  If you have had an infection related to diverticulosis, treatment may include  increasing the fiber in your diet, taking supplements, or taking medicines. This information is not intended to replace advice given to you by your health care provider. Make sure you discuss any questions you have with your health care provider. Document Released: 11/07/2003 Document Revised: 01/22/2017 Document Reviewed: 12/30/2015 Elsevier Patient Education  [place].  Colon Polyps  Polyps are tissue growths inside the body. Polyps can grow in many places, including the large intestine (colon). A polyp may be a round bump or a mushroom-shaped growth. You could have one polyp or several. Most colon polyps are noncancerous (benign). However, some colon polyps can become cancerous over time. Finding and removing the polyps early can help prevent this. What are the causes? The exact cause of colon polyps is not known. What increases the risk? You are more likely to develop this condition if you:  Have a family history of colon cancer or colon polyps.  Are older than 3 or older than 45 if you are African American.  Have inflammatory bowel disease, such as ulcerative colitis or Crohn's disease.  Have certain hereditary conditions, such as: ? Familial adenomatous polyposis. ? Lynch syndrome. ? Turcot syndrome. ? Peutz-Jeghers syndrome.  Are overweight.  Smoke cigarettes.  Do not get enough exercise.  Drink too much alcohol.  Eat a diet that is high in fat and red meat and low in fiber.  Had childhood cancer that was treated with abdominal radiation. What are the signs or symptoms? Most polyps do not cause symptoms. If you have symptoms, they may include:  Blood coming from your rectum when having a bowel movement.  Blood in your stool. The stool may look dark red or black.  Abdominal pain.  A change in bowel habits, such as constipation or diarrhea. How is this diagnosed? This condition is diagnosed with a colonoscopy. This is a procedure in which a lighted,  flexible scope is inserted into the anus and then passed into the colon to examine the area. Polyps are sometimes found when a colonoscopy is done as part of routine  cancer screening tests. How is this treated? Treatment for this condition involves removing any polyps that are found. Most polyps can be removed during a colonoscopy. Those polyps will then be tested for cancer. Additional treatment may be needed depending on the results of testing. Follow these instructions at home: Lifestyle  Maintain a healthy weight, or lose weight if recommended by your health care provider.  Exercise every day or as told by your health care provider.  Do not use any products that contain nicotine or tobacco, such as cigarettes and e-cigarettes. If you need help quitting, ask your health care provider.  If you drink alcohol, limit how much you have: ? 0-1 drink a day for women. ? 0-2 drinks a day for men.  Be aware of how much alcohol is in your drink. In the [place] one drink equals one 12 oz bottle of beer (355 mL), one 5 oz glass of wine (148 mL), or one 1 oz shot of hard liquor (44 mL). Eating and drinking   Eat foods that are high in fiber, such as fruits, vegetables, and whole grains.  Eat foods that are high in calcium and vitamin D, such as milk, cheese, yogurt, eggs, liver, fish, and broccoli.  Limit foods that are high in fat, such as fried foods and desserts.  Limit the amount of red meat and processed meat you eat, such as hot dogs, sausage, bacon, and lunch meats. General instructions  Keep all follow-up visits as told by your health care provider. This is important. ? This includes having regularly scheduled colonoscopies. ? Talk to your health care provider about when you need a colonoscopy. Contact a health care provider if:  You have new or worsening bleeding during a bowel movement.  You have new or increased blood in your stool.  You have a change in bowel habits.  You lose  weight for no known reason. Summary  Polyps are tissue growths inside the body. Polyps can grow in many places, including the colon.  Most colon polyps are noncancerous (benign), but some can become cancerous over time.  This condition is diagnosed with a colonoscopy.  Treatment for this condition involves removing any polyps that are found. Most polyps can be removed during a colonoscopy. This information is not intended to replace advice given to you by your health care provider. Make sure you discuss any questions you have with your health care provider. Document Released: 11/06/2003 Document Revised: 05/27/2017 Document Reviewed: 05/27/2017 Elsevier Patient Education  2020 [employer].

## 2018-08-25 NOTE — H&P (Signed)
Heather Delgado is an 58 y.o. female.   Chief Complaint: Patient is here for colonoscopy. HPI: She is 58 year old Caucasian female who developed rectal bleeding while she was on NSAID which has been discontinued and bleeding is stopped.  Bleeding started after she had been on NSAID for about a month.  He passed bright red blood per rectum.  He is prone to diarrhea but lately her bowels been very regular.  She states he has not experienced diverticulitis for several months.  First episode occurred 17 years ago. Family history is negative for CRC.  Past Medical History:  Diagnosis Date  . Arthritis   . Asthma   . Back pain   . Diverticulitis   . DJD (degenerative joint disease)   . Hypertension   . Reflux   . Spondylolysis     Past Surgical History:  Procedure Laterality Date  . CARPAL TUNNEL RELEASE    . CESAREAN SECTION    . CHOLECYSTECTOMY    . FINGER SURGERY    . I&D EXTREMITY  12/30/2011   Procedure: IRRIGATION AND DEBRIDEMENT EXTREMITY;  Surgeon: Roseanne Kaufman, MD;  Location: [facility];  Service: Orthopedics;  Laterality: Right;  1st finger  . INFECTED SKIN DEBRIDEMENT  12/29/2011   RIGHT INDEX   . IR RADIOLOGIST EVAL & MGMT  03/04/2017  . IR VERTEBROPLASTY LUMBAR BX INC UNI/BIL INC/INJECT/IMAGING  03/12/2017  . KNEE SURGERY      Family History  Problem Relation Age of Onset  . Stroke Maternal Grandmother   . Heart disease Paternal Grandmother   . Heart disease Maternal Grandfather   . Cancer Maternal Grandfather   . Alzheimer's disease Maternal Grandfather   . Heart disease Paternal Grandfather    Social History:  reports that she has never smoked. She has never used smokeless tobacco. She reports that she does not drink alcohol or use drugs.  Allergies:  Allergies  Allergen Reactions  . Duramorph [Morphine] Rash    "full body rash"  . Sulfa Antibiotics Anaphylaxis and Rash  . Triamterene Anaphylaxis and Rash  . Gabapentin Other (See Comments)    Caused mouth ulcers.    . Lyrica [Pregabalin]     Nightmares / drunken feeling   . Thimerosal     blisters  . Latex Rash    Medications Prior to Admission  Medication Sig Dispense Refill  . acetaminophen (TYLENOL) 500 MG tablet Take 1,000 mg by mouth every 6 (six) hours as needed for moderate pain or headache.    . Alpha-Lipoic Acid 600 MG CAPS Take 1,200 mg by mouth at bedtime.    Marland Kitchen atenolol (TENORMIN) 25 MG tablet Take 25-50 mg by mouth See admin instructions. Take 25 mg every night, on days when experiencing palpitations take 50 mg instead    . buPROPion (WELLBUTRIN XL) 150 MG 24 hr tablet Take 150 mg by mouth at bedtime.     . Cholecalciferol (VITAMIN D) 50 MCG (2000 UT) tablet Take 4,000 Units by mouth at bedtime.    . clobetasol ointment (TEMOVATE) 5.18 % Apply 1 application topically daily as needed (lichen sclerosus).     . DULoxetine (CYMBALTA) 60 MG capsule Take 60 mg by mouth at bedtime.     . furosemide (LASIX) 40 MG tablet Take 40 mg by mouth daily as needed for edema.    Marland Kitchen levocetirizine (XYZAL) 5 MG tablet Take 5 mg by mouth at bedtime.    . Liniments (BLUE-EMU SUPER STRENGTH EX) Apply 1 application topically daily as  needed (knee pain).    . Liniments (SALONPAS PAIN RELIEF PATCH EX) Apply 1 patch topically daily as needed (pain).    Marland Kitchen lisinopril (PRINIVIL,ZESTRIL) 20 MG tablet Take 20 mg by mouth at bedtime.    . Magnesium 400 MG TABS Take 400 mg by mouth daily as needed (leg cramps).    . montelukast (SINGULAIR) 10 MG tablet Take 10 mg by mouth at bedtime.     Marland Kitchen OVER THE COUNTER MEDICATION Take 1 capsule by mouth 3 (three) times daily before meals. green lipped mussel otc supplement    . Probiotic Product (PROBIOTIC DAILY PO) Take 1 tablet by mouth daily as needed (upset stomach).     . RESVERATROL PO Take 2,000 mg by mouth at bedtime.    Marland Kitchen SPIRULINA PO Take 2,000 mg by mouth at bedtime.    . traMADol (ULTRAM) 50 MG tablet Take 50 mg by mouth daily as needed for moderate pain.     . Turmeric  Curcumin 500 MG CAPS Take 500 mg by mouth at bedtime.    Marland Kitchen albuterol (PROVENTIL HFA;VENTOLIN HFA) 108 (90 BASE) MCG/ACT inhaler Inhale 2 puffs into the lungs every 6 (six) hours as needed for wheezing or shortness of breath.    . budesonide-formoterol (SYMBICORT) 160-4.5 MCG/ACT inhaler Inhale 2 puffs into the lungs 2 (two) times daily as needed (shortness of breath).     . EPINEPHrine (EPIPEN 2-PAK) 0.3 mg/0.3 mL IJ SOAJ injection Inject 0.3 mg into the muscle daily as needed for anaphylaxis.     . fluticasone (FLONASE) 50 MCG/ACT nasal spray Place 1 spray into both nostrils daily as needed for allergies or rhinitis.    Marland Kitchen oxyCODONE-acetaminophen (PERCOCET/ROXICET) 5-325 MG tablet Take 1 tablet by mouth every 4 (four) hours as needed. (Patient taking differently: Take 1 tablet by mouth daily as needed for moderate pain. ) 20 tablet 0  . Polyvinyl Alcohol-Povidone (REFRESH OP) Place 1 drop into both eyes daily as needed (dry eyes).      No results found for this or any previous visit (from the past 48 hour(s)). No results found.  ROS  Blood pressure (!) 150/91, temperature 98.7 F (37.1 C), temperature source Oral, resp. rate 19, height 5\' 4"  (1.626 m), weight (!) 152.9 kg, SpO2 97 %. Physical Exam  Constitutional:  Obese Caucasian female in NAD.  HENT:  Mouth/Throat: Oropharynx is clear and moist.  Eyes: Conjunctivae are normal. No scleral icterus.  Neck: No thyromegaly present.  Cardiovascular: Normal rate, regular rhythm and normal heart sounds.  No murmur heard. Respiratory: Effort normal and breath sounds normal.  GI:  Abdomen is full.  She has Pfannenstiel scar.  On palpation abdomen is soft.  She has 2 small subcutaneous lipomas in left midabdomen.  No organomegaly or masses.  Musculoskeletal:        General: No edema.  Lymphadenopathy:    She has no cervical adenopathy.  Neurological: She is alert.  Skin: Skin is warm and dry.     Assessment/Plan Rectal  bleeding. Diagnostic colonoscopy.  Hildred Laser, MD 08/25/2018, 10:20 AM

## 2018-09-01 ENCOUNTER — Encounter (HOSPITAL_COMMUNITY): Payer: Self-pay | Admitting: Internal Medicine

## 2018-12-06 IMAGING — MR MR LUMBAR SPINE W/O CM
4 of 5 series · 21 of 48 positions shown · non-contrast
Comparison: 02/14/2017 lumbar spine CT. 12/17/2009 lumbar spine
MRI.

CLINICAL DATA: 56 y/o  F; lower back pain after falling on ice.

EXAM:
MRI LUMBAR SPINE WITHOUT CONTRAST
TECHNIQUE: Multiplanar, multisequence MR imaging of the lumbar spine was
performed. No intravenous contrast was administered.

[Series 2: T2 · sagittal · 4.5mm · 0.55mm/px · 6 of 15 slices shown (1 of 2)]
[im 1/15]
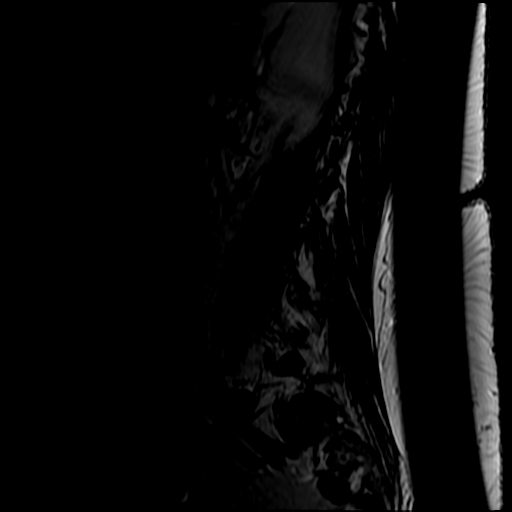
[im 3/15]
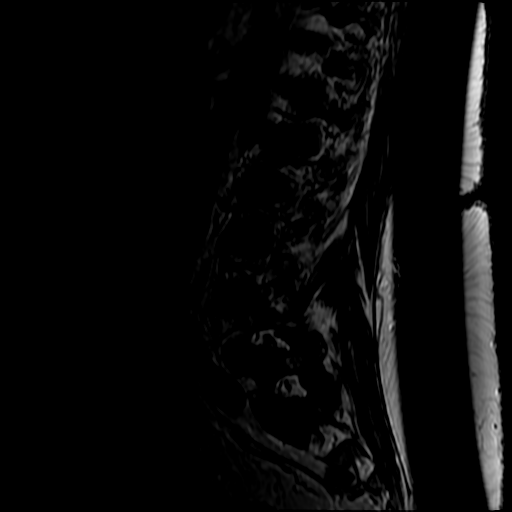
[im 6/15]
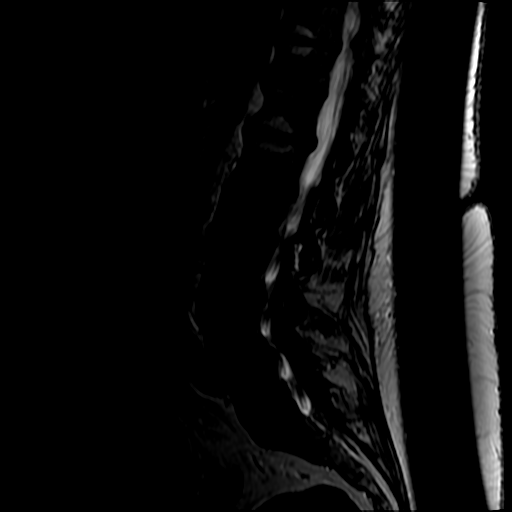
[im 9/15]
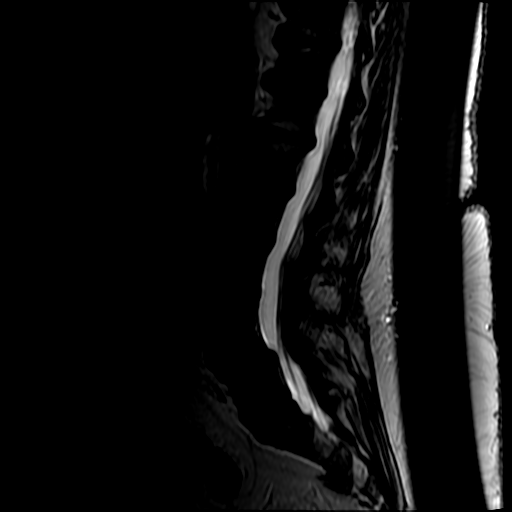
[im 12/15]
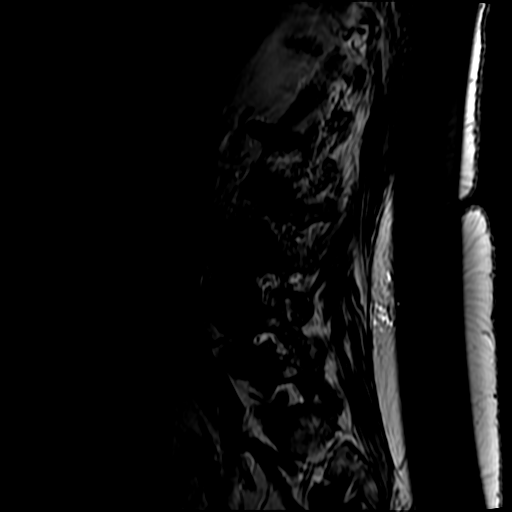
[im 15/15]
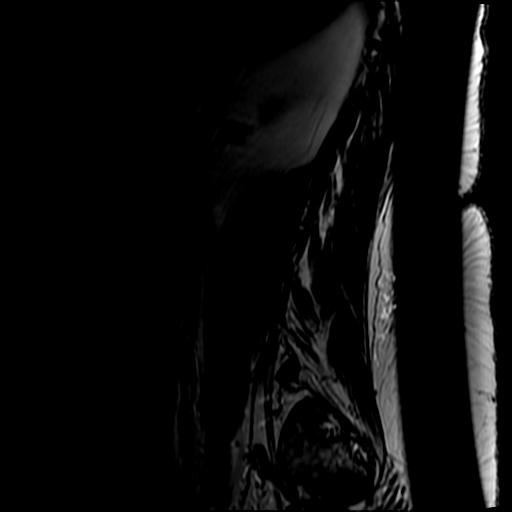

[Series 4: T1 · sagittal · 4.5mm · 0.55mm/px · 3 of 15 slices shown (1 of 2)]
[im 3/15]
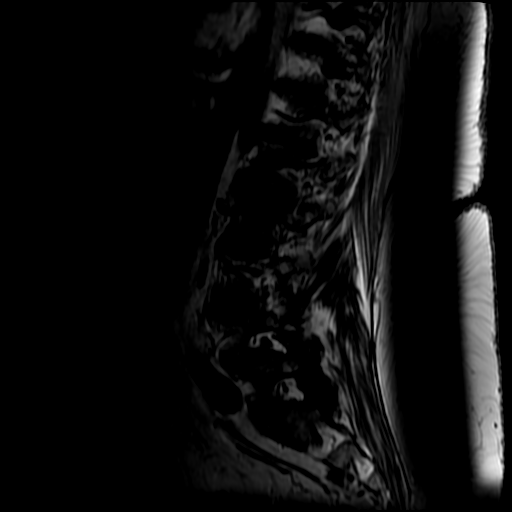
[im 9/15]
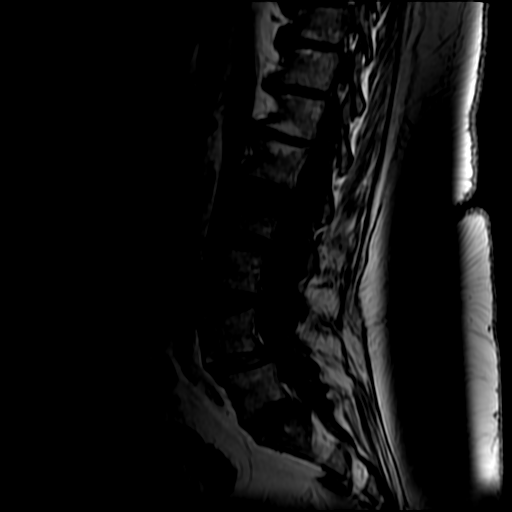
[im 15/15]
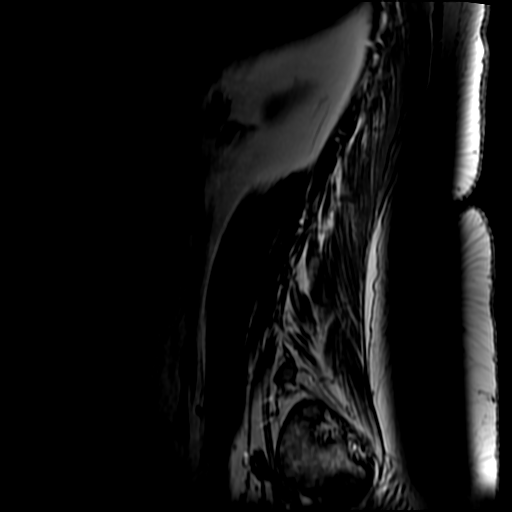

[Series 5: T2 · axial · 4.0mm · 0.43mm/px · z∈[-46,+160]mm · 9 of 37 slices shown (2 of 2)]
[im 1/37]
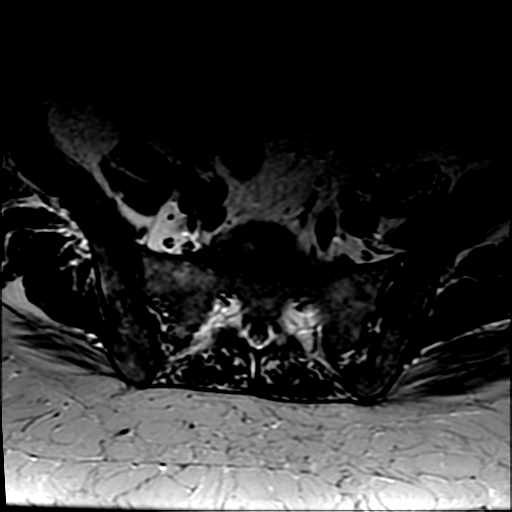
[im 6/37]
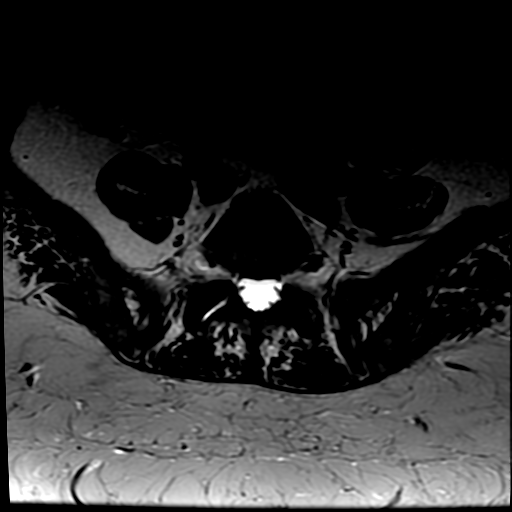
[im 11/37]
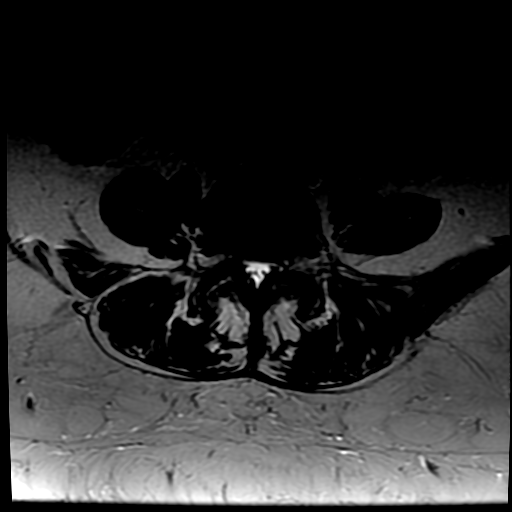
[im 16/37]
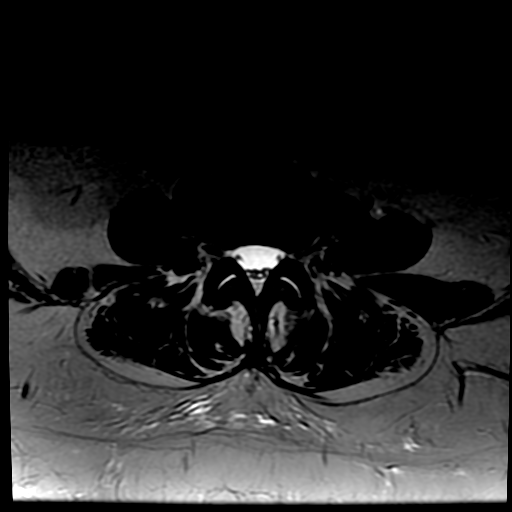
[im 19/37]
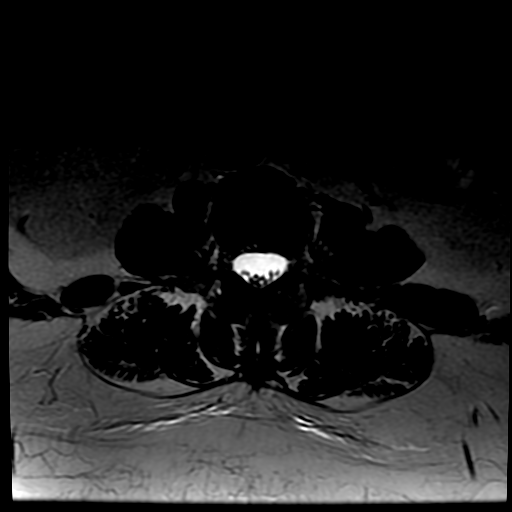
[im 21/37]
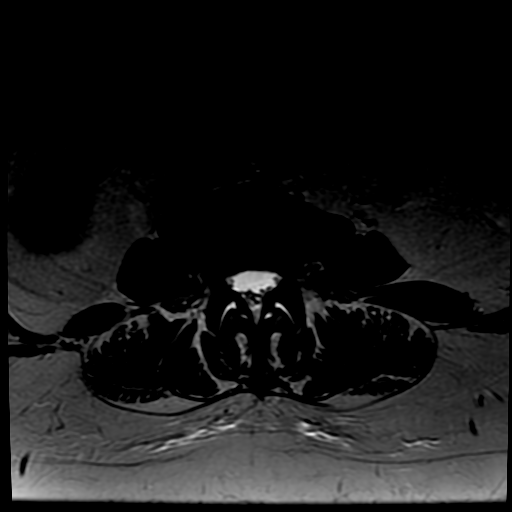
[im 26/37]
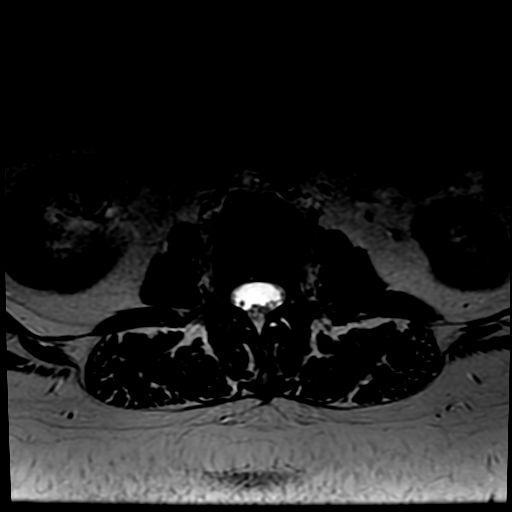
[im 31/37]
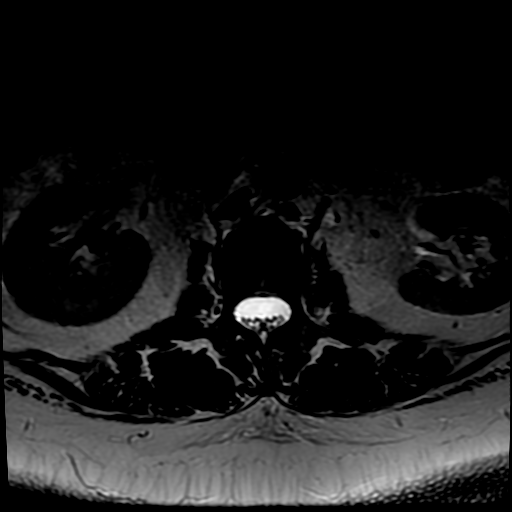
[im 37/37]
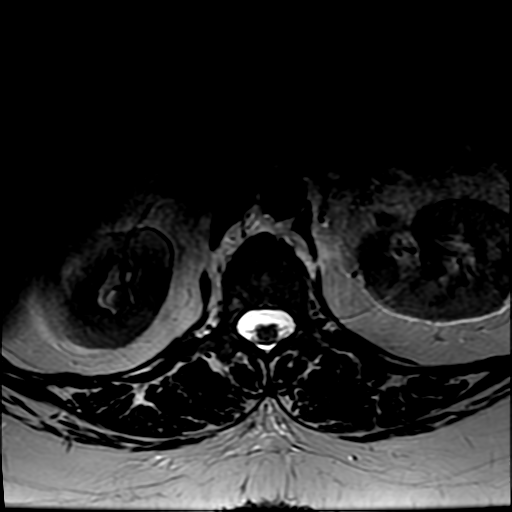

[Series 6: T1 · axial · 4.0mm · 0.86mm/px · z∈[-22,+130]mm · 3 of 37 slices shown (2 of 2)]
[im 6/37]
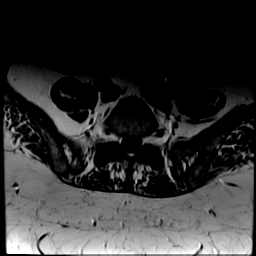
[im 19/37]
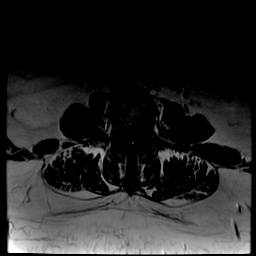
[im 31/37]
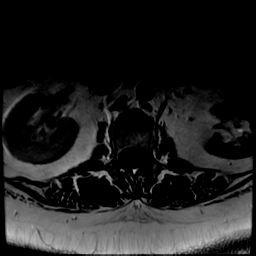

[21 of 48 positions shown; findings below may reference images not displayed]

FINDINGS: Segmentation:  Standard.

Alignment:  Grade 1 L4-5 anterolisthesis.

Vertebrae: L2 mild anterior compression deformity with edema
indicating recent injury. No additional acute fracture identified.
Bilateral L1-L4 small facet effusions.

Conus medullaris and cauda equina: Conus extends to the L1 level.
Conus and cauda equina appear normal.

Paraspinal and other soft tissues: Negative.

Disc levels:

L1-2: Small disc bulge. No significant foraminal or canal stenosis.

L2-3: Small disc bulge. No significant foraminal or canal stenosis.

L3-4: No significant disc displacement, foraminal stenosis, or canal
stenosis.

L4-5: Progressed grade 1 anterolisthesis, uncovered disc bulge, and
severe facet hypertrophy. Mild bilateral foraminal stenosis.
Moderate canal stenosis.

L5-S1: No significant disc displacement, foraminal stenosis, or
canal stenosis. Moderate facet hypertrophy.
IMPRESSION: 1. L2 mild anterior compression deformity with edema indicating
recent injury. No additional acute osseous abnormality identified.
2. Progression of lumbar spondylosis with increased L4-5
anterolisthesis and lower lumbar facet arthropathy.
3. Multifactorial moderate L4-5 canal stenosis.
4. Mild bilateral L4-5 foraminal stenosis.

By: Nathalie Maye M.D.

## 2018-12-26 IMAGING — XA IR VERTEBROPLASTY LUMBOSACRAL INJ
1 series · 14 of 24 positions shown · IV contrast (IODINE)
Comparison: none

INDICATION: Severe lumbar pain secondary to compression fracture at L2.
TECHNIQUE: Informed written consent was obtained from the patient after a
thorough discussion of the procedural risks, benefits and
alternatives. All questions were addressed. Maximal Sterile Barrier
Technique was utilized including caps, mask, sterile gowns, sterile
gloves, sterile drape, hand hygiene and skin antiseptic. A timeout
was performed prior to the initiation of the procedure.

[Series 300: spine · 14 of 44 slices shown]
[im 1/44]
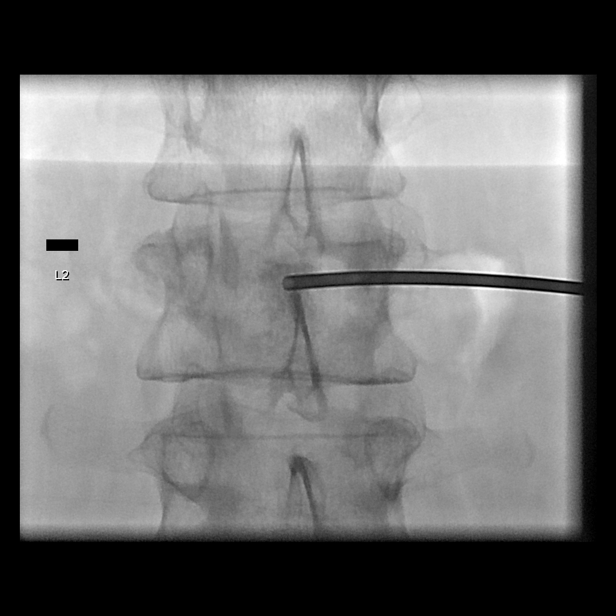
[im 4/44]
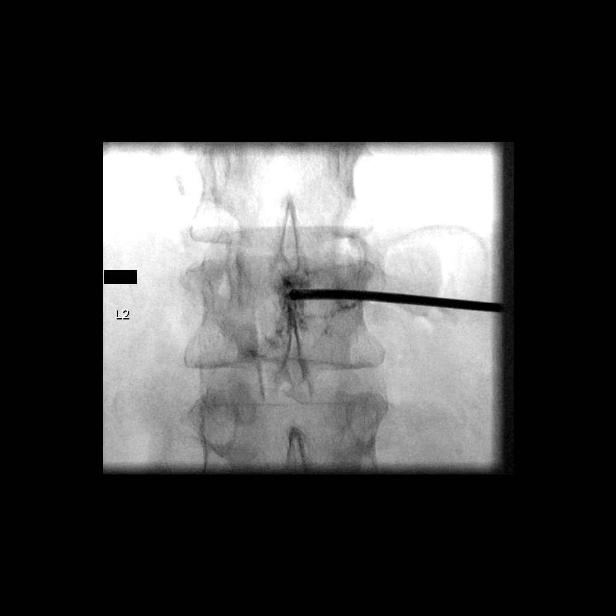
[im 8/44]
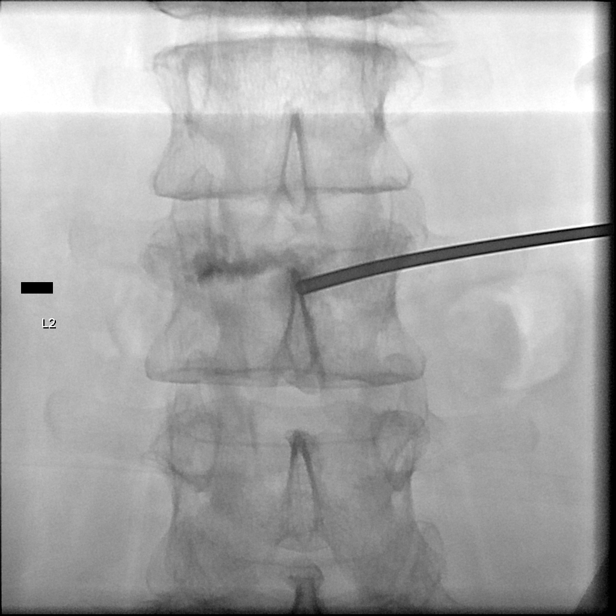
[im 12/44]
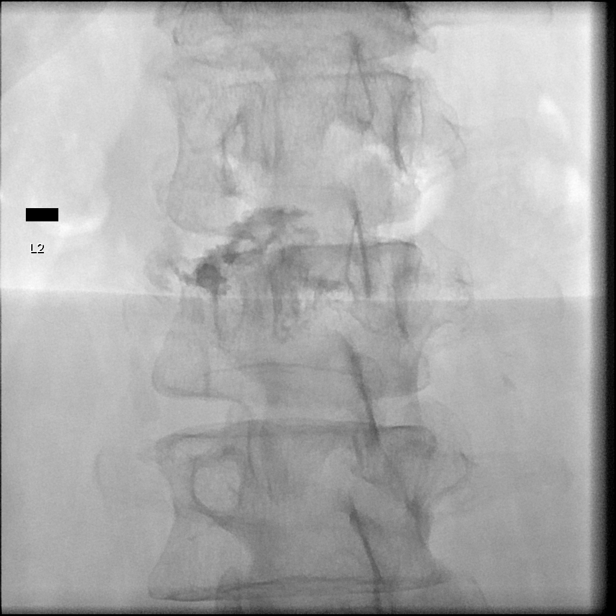
[im 14/44]
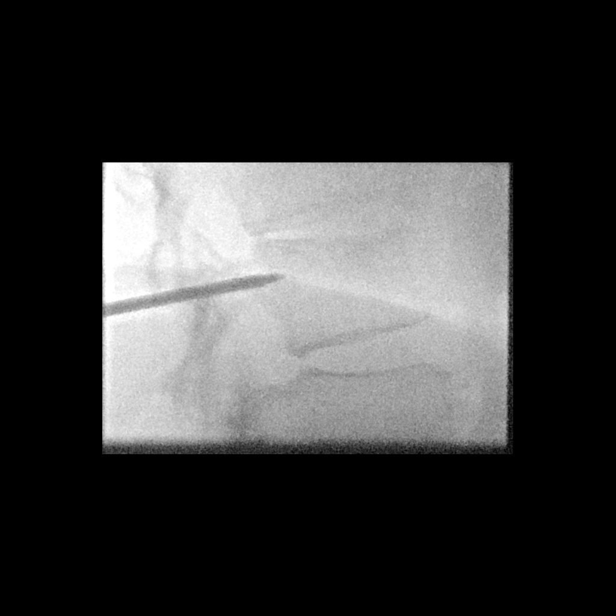
[im 17/44]
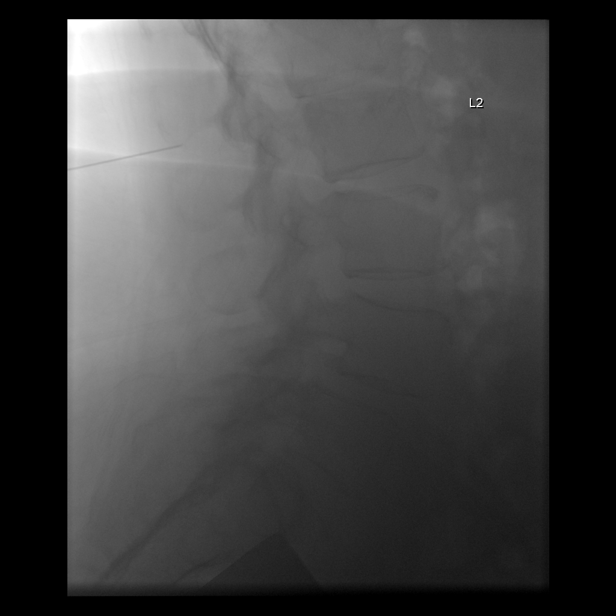
[im 21/44]
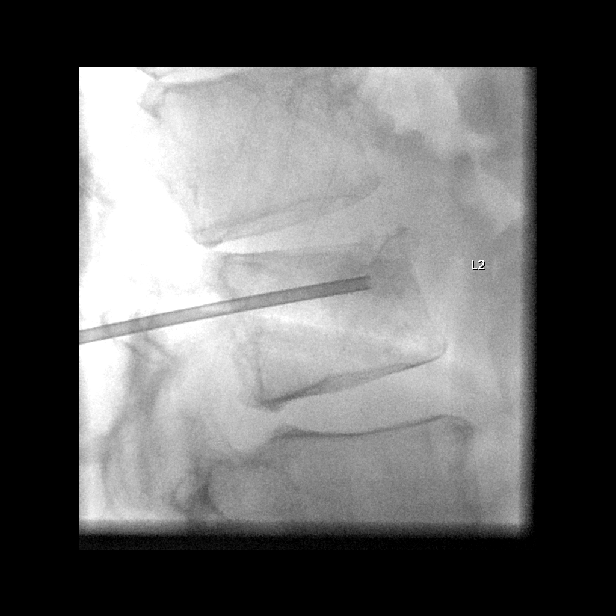
[im 23/44]
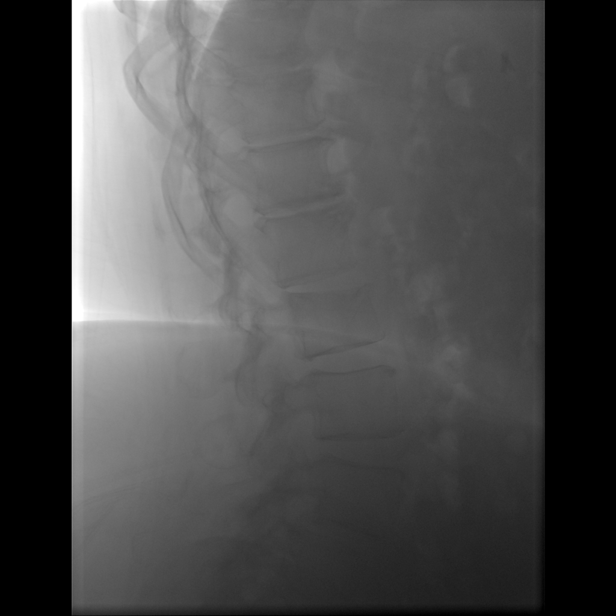
[im 27/44]
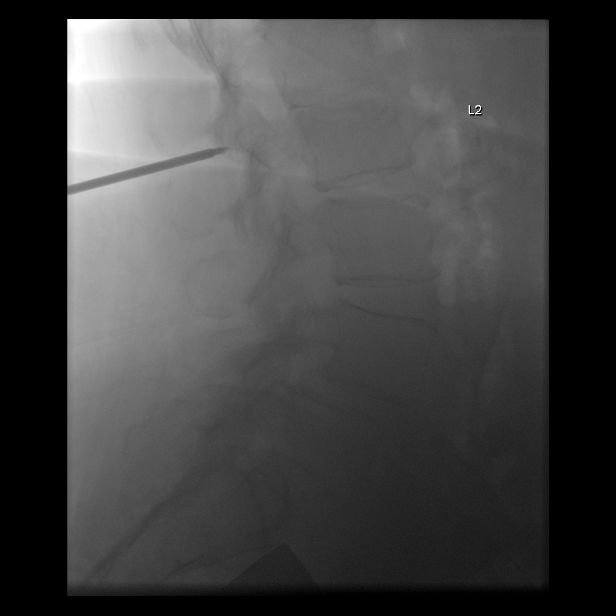
[im 30/44]
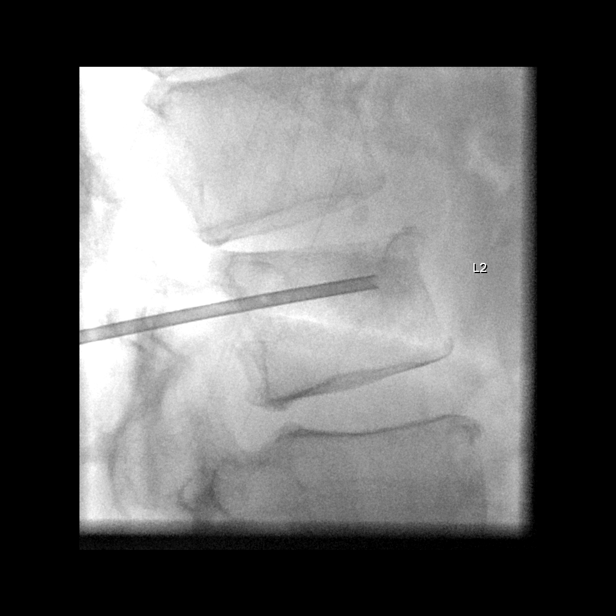
[im 34/44]
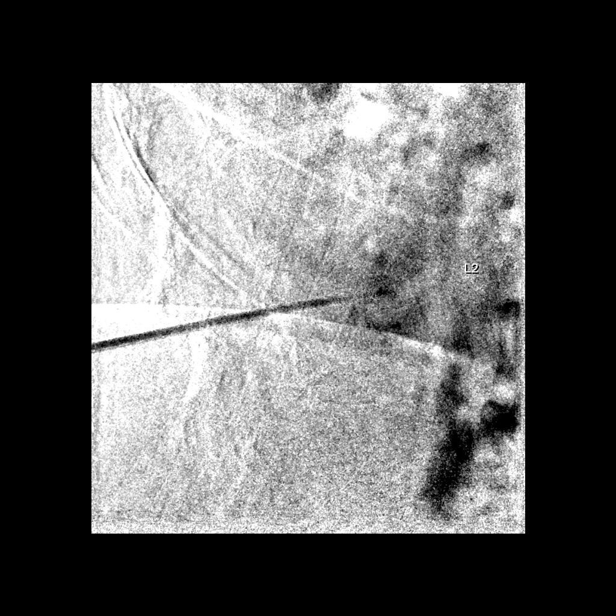
[im 36/44]
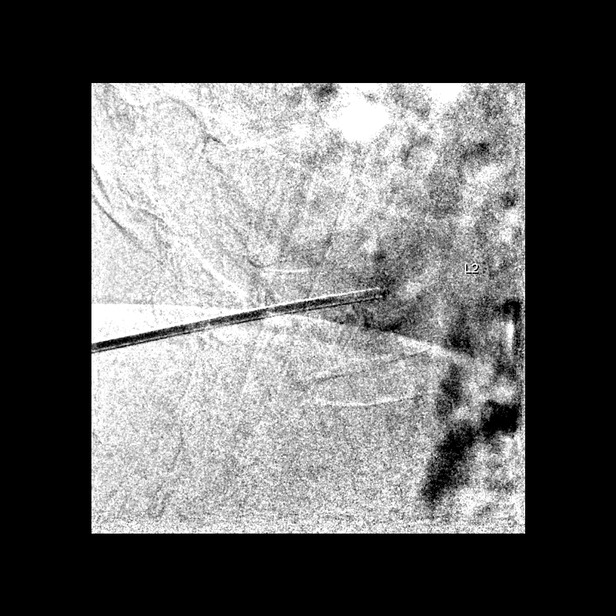
[im 40/44]
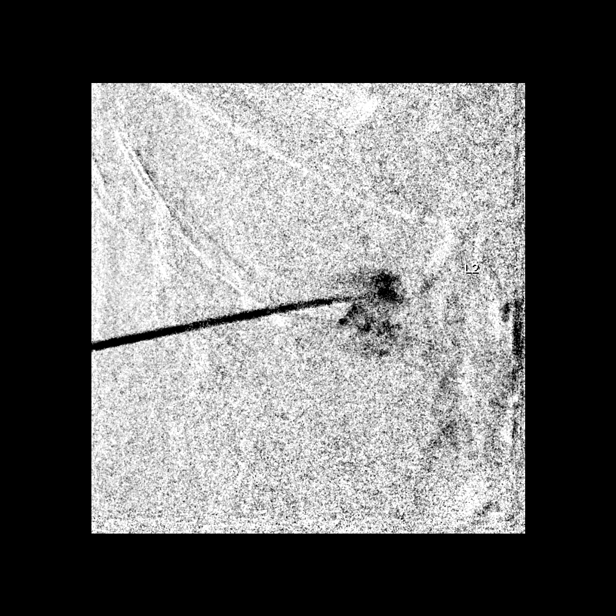
[im 44/44]
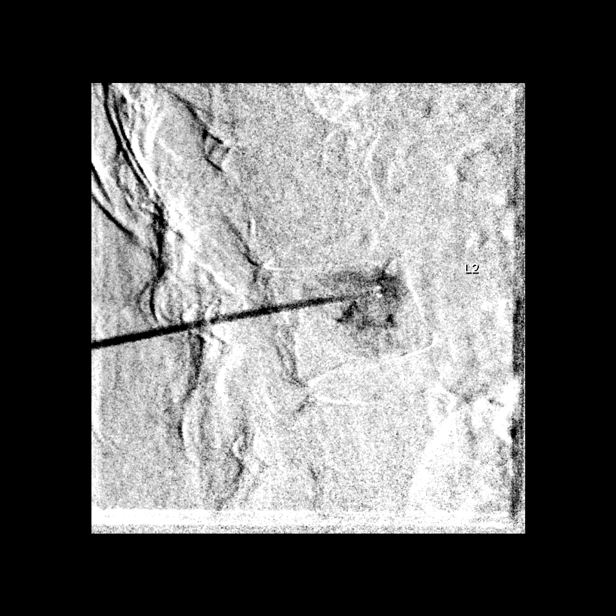

[14 of 24 positions shown; findings below may reference images not displayed]

EXAM:
VERTEBROPLASTY AT L2

MEDICATIONS:
As antibiotic prophylaxis, Ancef 2 g IV ordered pre-procedure and
administered intravenously within 1 hour of incision.

ANESTHESIA/SEDATION:
Moderate (conscious) sedation was employed during this procedure. A
total of Versed 4 mg and Fentanyl 150 mcg was administered
intravenously.

Moderate Sedation Time: 47 minutes. The patient's level of
consciousness and vital signs were monitored continuously by
radiology nursing throughout the procedure under my direct
supervision.

FLUOROSCOPY TIME:  Fluoroscopy Time: 12 minutes 30 seconds (4422
mGy)

COMPLICATIONS:
None immediate.
PROCEDURE:
The patient was placed prone on the fluoroscopic table. Nasal oxygen
was administered. Physiologic monitoring was performed throughout
the duration of the procedure. The skin overlying the lumbar region
was prepped and draped in the usual sterile fashion. The L2
vertebral body was identified and the right pedicle was infiltrated
with 0.25% Bupivacaine. This was then followed by the advancement of
a 13-gauge Cook needle through the right pedicle into the anterior
one-third at L2. A gentle contrast injection demonstrated a
trabecular pattern of contrast.

At this time, methylmethacrylate mixture was reconstituted. Under
biplane intermittent fluoroscopy, the methylmethacrylate was then
injected into the L2 vertebral body with filling of the vertebral
body.

A small sliver of the methylmethacrylate was seen to extend through
the fracture cleft along the superior endplate. No extravasation was
noted posteriorly into the spinal canal. No epidural venous
contamination was seen.

The needle was then removed. Hemostasis was achieved at the skin
entry site.

There were no acute complications. Patient tolerated the procedure
well. The patient was observed for 3 hours and discharged in good
condition.
IMPRESSION: 1. Status post vertebral body augmentation for painful compression
fracture at L2 using vertebroplasty technique.

## 2018-12-28 DIAGNOSIS — R7303 Prediabetes: Secondary | ICD-10-CM | POA: Diagnosis not present

## 2018-12-28 DIAGNOSIS — F39 Unspecified mood [affective] disorder: Secondary | ICD-10-CM | POA: Diagnosis not present

## 2018-12-28 DIAGNOSIS — I10 Essential (primary) hypertension: Secondary | ICD-10-CM | POA: Diagnosis not present

## 2018-12-28 DIAGNOSIS — J069 Acute upper respiratory infection, unspecified: Secondary | ICD-10-CM | POA: Diagnosis not present

## 2018-12-28 DIAGNOSIS — I493 Ventricular premature depolarization: Secondary | ICD-10-CM | POA: Diagnosis not present

## 2019-03-17 DIAGNOSIS — I493 Ventricular premature depolarization: Secondary | ICD-10-CM | POA: Diagnosis not present

## 2019-03-17 DIAGNOSIS — F39 Unspecified mood [affective] disorder: Secondary | ICD-10-CM | POA: Diagnosis not present

## 2019-03-17 DIAGNOSIS — I10 Essential (primary) hypertension: Secondary | ICD-10-CM | POA: Diagnosis not present

## 2019-03-17 DIAGNOSIS — Z20822 Contact with and (suspected) exposure to covid-19: Secondary | ICD-10-CM | POA: Diagnosis not present

## 2019-03-17 DIAGNOSIS — R7303 Prediabetes: Secondary | ICD-10-CM | POA: Diagnosis not present

## 2019-04-25 ENCOUNTER — Emergency Department (HOSPITAL_COMMUNITY): Payer: BC Managed Care – PPO

## 2019-04-25 ENCOUNTER — Other Ambulatory Visit: Payer: Self-pay

## 2019-04-25 ENCOUNTER — Encounter (HOSPITAL_COMMUNITY): Payer: Self-pay | Admitting: Emergency Medicine

## 2019-04-25 ENCOUNTER — Emergency Department (HOSPITAL_COMMUNITY)
Admission: EM | Admit: 2019-04-25 | Discharge: 2019-04-25 | Disposition: A | Discharge status: Home / Self Care | Payer: BC Managed Care – PPO | Attending: Emergency Medicine | Admitting: Emergency Medicine

## 2019-04-25 DIAGNOSIS — I10 Essential (primary) hypertension: Secondary | ICD-10-CM | POA: Diagnosis not present

## 2019-04-25 DIAGNOSIS — Z9049 Acquired absence of other specified parts of digestive tract: Secondary | ICD-10-CM | POA: Diagnosis not present

## 2019-04-25 DIAGNOSIS — J45909 Unspecified asthma, uncomplicated: Secondary | ICD-10-CM | POA: Insufficient documentation

## 2019-04-25 DIAGNOSIS — R072 Precordial pain: Secondary | ICD-10-CM | POA: Diagnosis not present

## 2019-04-25 DIAGNOSIS — Z91040 Latex allergy status: Secondary | ICD-10-CM | POA: Diagnosis not present

## 2019-04-25 DIAGNOSIS — R079 Chest pain, unspecified: Secondary | ICD-10-CM | POA: Diagnosis not present

## 2019-04-25 DIAGNOSIS — Z79899 Other long term (current) drug therapy: Secondary | ICD-10-CM | POA: Diagnosis not present

## 2019-04-25 DIAGNOSIS — R0789 Other chest pain: Secondary | ICD-10-CM | POA: Diagnosis not present

## 2019-04-25 LAB — BASIC METABOLIC PANEL
Anion gap: 7 (ref 5–15)
BUN: 16 mg/dL (ref 6–20)
CO2: 27 mmol/L (ref 22–32)
Calcium: 9.5 mg/dL (ref 8.9–10.3)
Chloride: 105 mmol/L (ref 98–111)
Creatinine, Ser: 0.76 mg/dL (ref 0.44–1.00)
GFR calc Af Amer: >60 mL/min (ref >60)
GFR calc non Af Amer: >60 mL/min (ref >60)
Glucose, Bld: 96 mg/dL (ref 70–99)
Potassium: 4.1 mmol/L (ref 3.5–5.1)
Sodium: 139 mmol/L (ref 135–145)

## 2019-04-25 LAB — CBC
HCT: 47.7 % — ABNORMAL HIGH (ref 36.0–46.0)
Hemoglobin: 15.2 g/dL — ABNORMAL HIGH (ref 12.0–15.0)
MCH: 29.1 pg (ref 26.0–34.0)
MCHC: 31.9 g/dL (ref 30.0–36.0)
MCV: 91.2 fL (ref 80.0–100.0)
Platelets: 302 10*3/uL (ref 150–400)
RBC: 5.23 MIL/uL — ABNORMAL HIGH (ref 3.87–5.11)
RDW: 13.6 % (ref 11.5–15.5)
WBC: 6.3 10*3/uL (ref 4.0–10.5)
nRBC: 0 % (ref 0.0–0.2)

## 2019-04-25 LAB — TROPONIN I (HIGH SENSITIVITY)
Troponin I (High Sensitivity): <2 ng/L (ref <18)
Troponin I (High Sensitivity): <2 ng/L (ref <18)

## 2019-04-25 MED ORDER — ASPIRIN 81 MG PO CHEW
324.0000 mg | CHEWABLE_TABLET | Freq: Once | ORAL | Status: AC
  Administered 2019-04-25: 324 mg via ORAL
  Filled 2019-04-25: qty 4

## 2019-04-25 NOTE — Discharge Instructions (Addendum)
You were seen in the emergency department for chest pain.  You had blood work EKG and a chest x-ray that did not show an obvious cause of your pain.  There was no evidence of a heart attack.  Please contact your primary care doctor for close follow-up and return to the emergency department if any worsening or concerning symptoms.

## 2019-04-25 NOTE — ED Provider Notes (Signed)
[facility] EMERGENCY DEPARTMENT Provider Note   CSN: [id number] Arrival date & time: 04/25/19  0941     History Chief Complaint  Patient presents with  . Chest Pain    Heather Delgado is a 59 y.o. female.  She has a history of hypertension.  She is complaining of sharp substernal chest pressure that radiates into her back and shoulders.  Started around 8 AM when she was driving to work.  She thinks it is anxiety.  She is under a lot of stress caring for her mother who is in hospice.  Not associated with any diaphoresis nausea shortness of breath dizziness.  No prior history of cardiac disease.  She said she had a cardiac cath years ago that was fine.  No recent illness.  The history is provided by the patient.  Chest Pain Pain location:  Substernal area Pain quality: pressure and sharp   Pain radiates to:  Upper back Pain severity:  Moderate Onset quality:  Sudden Duration:  2 hours Timing:  Constant Progression:  Unchanged Chronicity:  New Context: stress   Relieved by:  None tried Worsened by:  Nothing Ineffective treatments:  None tried Associated symptoms: anxiety   Associated symptoms: no abdominal pain, no altered mental status, no dizziness, no fever, no headache, no nausea, no numbness, no shortness of breath, no syncope and no vomiting   Risk factors: hypertension and obesity     HPI: A 59 year old patient with a history of hypertension and obesity presents for evaluation of chest pain. Initial onset of pain was approximately 1-3 hours ago. The patient's chest pain is described as heaviness/pressure/tightness, is sharp and is not worse with exertion. The patient's chest pain is middle- or left-sided, is not well-localized and does radiate to the arms/jaw/neck. The patient does not complain of nausea and denies diaphoresis. The patient has no history of stroke, has no history of peripheral artery disease, has not smoked in the past 90 days, denies any history of treated  diabetes, has no relevant family history of coronary artery disease (first degree relative at less than age 55) and has no history of hypercholesterolemia.   Past Medical History:  Diagnosis Date  . Arthritis   . Asthma   . Back pain   . Diverticulitis   . DJD (degenerative joint disease)   . Hypertension   . Reflux   . Spondylolysis     Patient Active Problem List   Diagnosis Date Noted  . Change in stool 08/16/2018  . Rectal bleeding 08/16/2018  . Abdominal pain, left lower quadrant 08/23/2013    Past Surgical History:  Procedure Laterality Date  . BIOPSY  08/25/2018   Procedure: BIOPSY;  Surgeon: Rogene Houston, MD;  Location: [facility];  Service: Endoscopy;;  . CARPAL TUNNEL RELEASE    . CESAREAN SECTION    . CHOLECYSTECTOMY    . COLONOSCOPY N/A 08/25/2018   Procedure: COLONOSCOPY;  Surgeon: Rogene Houston, MD;  Location: [facility];  Service: Endoscopy;  Laterality: N/A;  1030  . FINGER SURGERY    . I & D EXTREMITY  12/30/2011   Procedure: IRRIGATION AND DEBRIDEMENT EXTREMITY;  Surgeon: Roseanne Kaufman, MD;  Location: [facility];  Service: Orthopedics;  Laterality: Right;  1st finger  . INFECTED SKIN DEBRIDEMENT  12/29/2011   RIGHT INDEX   . IR RADIOLOGIST EVAL & MGMT  03/04/2017  . IR VERTEBROPLASTY LUMBAR BX INC UNI/BIL INC/INJECT/IMAGING  03/12/2017  . KNEE SURGERY    .  POLYPECTOMY  08/25/2018   Procedure: POLYPECTOMY;  Surgeon: Rogene Houston, MD;  Location: [facility];  Service: Endoscopy;;     OB History   No obstetric history on file.     Family History  Problem Relation Age of Onset  . Stroke Maternal Grandmother   . Heart disease Paternal Grandmother   . Heart disease Maternal Grandfather   . Cancer Maternal Grandfather   . Alzheimer's disease Maternal Grandfather   . Heart disease Paternal Grandfather     Social History   Tobacco Use  . Smoking status: Never Smoker  . Smokeless tobacco: Never Used  Substance Use Topics  . Alcohol use: No      Comment: occ  . Drug use: No    Home Medications Prior to Admission medications   Medication Sig Start Date End Date Taking? Authorizing Provider  acetaminophen (TYLENOL) 500 MG tablet Take 1,000 mg by mouth every 6 (six) hours as needed for moderate pain or headache.    [provider]  albuterol (PROVENTIL HFA;VENTOLIN HFA) 108 (90 BASE) MCG/ACT inhaler Inhale 2 puffs into the lungs every 6 (six) hours as needed for wheezing or shortness of breath.    [provider]  Alpha-Lipoic Acid 600 MG CAPS Take 1,200 mg by mouth at bedtime.    [provider]  atenolol (TENORMIN) 25 MG tablet Take 25-50 mg by mouth See admin instructions. Take 25 mg every night, on days when experiencing palpitations take 50 mg instead    [provider]  budesonide-formoterol (SYMBICORT) 160-4.5 MCG/ACT inhaler Inhale 2 puffs into the lungs 2 (two) times daily as needed (shortness of breath).     [provider]  buPROPion (WELLBUTRIN XL) 150 MG 24 hr tablet Take 150 mg by mouth at bedtime.     [provider]  Cholecalciferol (VITAMIN D) 50 MCG (2000 UT) tablet Take 4,000 Units by mouth at bedtime.    [provider]  clobetasol ointment (TEMOVATE) [id number] % Apply 1 application topically daily as needed (lichen sclerosus).     [provider]  DULoxetine (CYMBALTA) 60 MG capsule Take 60 mg by mouth at bedtime.     [provider]  EPINEPHrine (EPIPEN 2-PAK) 0.3 mg/0.3 mL IJ SOAJ injection Inject 0.3 mg into the muscle daily as needed for anaphylaxis.  09/19/12   [provider]  fluticasone (FLONASE) 50 MCG/ACT nasal spray Place 1 spray into both nostrils daily as needed for allergies or rhinitis.    [provider]  furosemide (LASIX) 40 MG tablet Take 40 mg by mouth daily as needed for edema.    [provider]  levocetirizine (XYZAL) 5 MG tablet Take 5 mg by mouth at bedtime.    [provider]   Liniments (BLUE-EMU SUPER STRENGTH EX) Apply 1 application topically daily as needed (knee pain).    [provider]  Liniments (SALONPAS PAIN RELIEF PATCH EX) Apply 1 patch topically daily as needed (pain).    [provider]  lisinopril (PRINIVIL,ZESTRIL) 20 MG tablet Take 20 mg by mouth at bedtime.    [provider]  Magnesium 400 MG TABS Take 400 mg by mouth daily as needed (leg cramps).    [provider]  montelukast (SINGULAIR) 10 MG tablet Take 10 mg by mouth at bedtime.     [provider]  OVER THE COUNTER MEDICATION Take 1 capsule by mouth 3 (three) times daily before meals. green lipped mussel otc supplement  [provider]  oxyCODONE-acetaminophen (PERCOCET/ROXICET) 5-325 MG tablet Take 1 tablet by mouth every 4 (four) hours as needed. Patient taking differently: Take 1 tablet by mouth daily as needed for moderate pain.  02/14/17   [name], PA-C  Polyvinyl Alcohol-Povidone (REFRESH OP) Place 1 drop into both eyes daily as needed (dry eyes).    [provider]  Probiotic Product (PROBIOTIC DAILY PO) Take 1 tablet by mouth daily as needed (upset stomach).     [provider]  RESVERATROL PO Take 2,000 mg by mouth at bedtime.    [provider]  SPIRULINA PO Take 2,000 mg by mouth at bedtime.    [provider]  traMADol (ULTRAM) 50 MG tablet Take 50 mg by mouth daily as needed for moderate pain.     [provider]  Turmeric Curcumin 500 MG CAPS Take 500 mg by mouth at bedtime.    [provider]    Allergies    Duramorph [morphine], Sulfa antibiotics, Triamterene, Gabapentin, Lyrica [pregabalin], Thimerosal, and Latex  Review of Systems   Review of Systems  Constitutional: Negative for fever.  HENT: Negative for sore throat.   Eyes: Negative for visual disturbance.  Respiratory: Negative for shortness of breath.   Cardiovascular: Positive for chest pain. Negative  for syncope.  Gastrointestinal: Negative for abdominal pain, nausea and vomiting.  Genitourinary: Negative for dysuria.  Musculoskeletal: Negative for gait problem.  Skin: Negative for rash.  Neurological: Negative for dizziness, numbness and headaches.    Physical Exam Updated Vital Signs BP (!) 151/93 (BP Location: Left Wrist)   Pulse 87   Resp 16   SpO2 100%   Physical Exam Vitals and nursing note reviewed.  Constitutional:      General: She is not in acute distress.    Appearance: She is well-developed. She is obese.  HENT:     Head: Normocephalic and atraumatic.  Eyes:     Conjunctiva/sclera: Conjunctivae normal.  Cardiovascular:     Rate and Rhythm: Normal rate and regular rhythm.     Heart sounds: Normal heart sounds. No murmur.  Pulmonary:     Effort: Pulmonary effort is normal. No respiratory distress.     Breath sounds: Normal breath sounds.  Abdominal:     Palpations: Abdomen is soft.     Tenderness: There is no abdominal tenderness.  Musculoskeletal:        General: Normal range of motion.     Cervical back: Neck supple.     Right lower leg: No tenderness.     Left lower leg: No tenderness.  Skin:    General: Skin is warm and dry.     Capillary Refill: Capillary refill takes less than 2 seconds.  Neurological:     General: No focal deficit present.     Mental Status: She is alert.     ED Results / Procedures / Treatments   Labs (all labs ordered are listed, but only abnormal results are displayed) Labs Reviewed  CBC - Abnormal; Notable for the following components:      Result Value   RBC 5.23 (*)    Hemoglobin 15.2 (*)    HCT 47.7 (*)    All other components within normal limits  BASIC METABOLIC PANEL  TROPONIN I (HIGH SENSITIVITY)  TROPONIN I (HIGH SENSITIVITY)    EKG EKG Interpretation  Date/Time:  Tuesday April 25 2019 09:56:10 EST Ventricular Rate:  81 PR Interval:    QRS Duration: 93 QT Interval:  399 QTC Calculation: 464 R  Axis:   52 Text Interpretation: Sinus rhythm Baseline wander in lead(s) V2 V3 V5 V6 No significant change since 7/13 Confirmed by Aletta Edouard [phone]) on 04/25/2019 10:00:38 AM   Radiology DG Chest Port 1 View  Result Date: 04/25/2019 CLINICAL DATA:  Patient complains of chest pain. EXAM: PORTABLE CHEST 1 VIEW COMPARISON:  03/13/2012 FINDINGS: The heart size and mediastinal contours are within normal limits. Both lungs are clear. The visualized skeletal structures are unremarkable. IMPRESSION: No active disease. Electronically Signed   By: Kerby Moors M.D.   On: 04/25/2019 10:21    Procedures Procedures (including critical care time)  Medications Ordered in ED Medications  aspirin chewable tablet 324 mg (324 mg Oral Given 04/25/19 1015)    ED Course  I have reviewed the triage vital signs and the nursing notes.  Pertinent labs & imaging results that were available during my care of the patient were reviewed by me and considered in my medical decision making (see chart for details).  Clinical Course as of Apr 25 1703  Tue Apr 25, 2019  1007 Differential includes ACS, pneumonia, pneumothorax, vascular, PE, musculoskeletal, reflux   [MB]  1007 Sats 90% on room air not tachypneic or tachycardic.  Pain not pleuritic.   [MB]  [id number] Chest x-ray interpreted by me as no pneumothorax no infiltrates.   [MB]  [id number] Patient's delta troponin unchanged.  Reviewed this with her and she is happy that it was not an evidence of heart attack.  She thinks it may be anxiety or the fact that she has had to help move her mother with transfers and might of pulled a muscle.  She understands to follow-up with her primary care doctor and return if any worsening or concerning symptoms.   [MB]    Clinical Course User Index [MB] Hayden Rasmussen, MD   MDM Rules/Calculators/A&P HEAR Score: 4                     Final Clinical Impression(s) / ED Diagnoses Final diagnoses:  Nonspecific chest pain    Rx /  DC Orders ED Discharge Orders    None       Hayden Rasmussen, MD 04/25/19 [id number]

## 2019-04-25 NOTE — ED Triage Notes (Signed)
Patient complains of chest pain that started this morning at 8am. Patient states pain is constant and radiates towards back.

## 2019-05-01 DIAGNOSIS — G44209 Tension-type headache, unspecified, not intractable: Secondary | ICD-10-CM | POA: Diagnosis not present

## 2019-05-01 DIAGNOSIS — F419 Anxiety disorder, unspecified: Secondary | ICD-10-CM | POA: Diagnosis not present

## 2019-12-11 DIAGNOSIS — I10 Essential (primary) hypertension: Secondary | ICD-10-CM | POA: Diagnosis not present

## 2019-12-11 DIAGNOSIS — R7303 Prediabetes: Secondary | ICD-10-CM | POA: Diagnosis not present

## 2019-12-11 DIAGNOSIS — R7301 Impaired fasting glucose: Secondary | ICD-10-CM | POA: Diagnosis not present

## 2019-12-11 DIAGNOSIS — I493 Ventricular premature depolarization: Secondary | ICD-10-CM | POA: Diagnosis not present

## 2019-12-11 DIAGNOSIS — F39 Unspecified mood [affective] disorder: Secondary | ICD-10-CM | POA: Diagnosis not present

## 2019-12-25 DIAGNOSIS — R7303 Prediabetes: Secondary | ICD-10-CM | POA: Diagnosis not present

## 2019-12-25 DIAGNOSIS — I10 Essential (primary) hypertension: Secondary | ICD-10-CM | POA: Diagnosis not present

## 2019-12-25 DIAGNOSIS — Z23 Encounter for immunization: Secondary | ICD-10-CM | POA: Diagnosis not present

## 2019-12-25 DIAGNOSIS — E782 Mixed hyperlipidemia: Secondary | ICD-10-CM | POA: Diagnosis not present

## 2020-02-26 DIAGNOSIS — R5383 Other fatigue: Secondary | ICD-10-CM | POA: Diagnosis not present

## 2020-02-26 DIAGNOSIS — R519 Headache, unspecified: Secondary | ICD-10-CM | POA: Diagnosis not present

## 2020-02-26 DIAGNOSIS — R6883 Chills (without fever): Secondary | ICD-10-CM | POA: Diagnosis not present

## 2020-06-10 DIAGNOSIS — M170 Bilateral primary osteoarthritis of knee: Secondary | ICD-10-CM | POA: Diagnosis not present

## 2020-06-10 DIAGNOSIS — R7303 Prediabetes: Secondary | ICD-10-CM | POA: Diagnosis not present

## 2020-06-10 DIAGNOSIS — R7301 Impaired fasting glucose: Secondary | ICD-10-CM | POA: Diagnosis not present

## 2020-06-10 DIAGNOSIS — E782 Mixed hyperlipidemia: Secondary | ICD-10-CM | POA: Diagnosis not present

## 2020-06-10 DIAGNOSIS — I10 Essential (primary) hypertension: Secondary | ICD-10-CM | POA: Diagnosis not present

## 2020-06-24 ENCOUNTER — Other Ambulatory Visit (HOSPITAL_COMMUNITY): Payer: Self-pay | Admitting: Internal Medicine

## 2020-06-24 ENCOUNTER — Other Ambulatory Visit: Payer: Self-pay | Admitting: Internal Medicine

## 2020-06-24 DIAGNOSIS — E041 Nontoxic single thyroid nodule: Secondary | ICD-10-CM

## 2020-06-24 DIAGNOSIS — R7303 Prediabetes: Secondary | ICD-10-CM | POA: Diagnosis not present

## 2020-06-24 DIAGNOSIS — M170 Bilateral primary osteoarthritis of knee: Secondary | ICD-10-CM | POA: Diagnosis not present

## 2020-06-24 DIAGNOSIS — E782 Mixed hyperlipidemia: Secondary | ICD-10-CM | POA: Diagnosis not present

## 2020-06-24 DIAGNOSIS — I10 Essential (primary) hypertension: Secondary | ICD-10-CM | POA: Diagnosis not present

## 2020-07-01 ENCOUNTER — Encounter (HOSPITAL_COMMUNITY): Payer: Self-pay

## 2020-07-01 ENCOUNTER — Ambulatory Visit (HOSPITAL_COMMUNITY): Payer: BC Managed Care – PPO

## 2020-07-18 DIAGNOSIS — M13842 Other specified arthritis, left hand: Secondary | ICD-10-CM | POA: Diagnosis not present

## 2020-07-25 DIAGNOSIS — R109 Unspecified abdominal pain: Secondary | ICD-10-CM | POA: Diagnosis not present

## 2020-07-25 DIAGNOSIS — R309 Painful micturition, unspecified: Secondary | ICD-10-CM | POA: Diagnosis not present

## 2020-07-25 DIAGNOSIS — N390 Urinary tract infection, site not specified: Secondary | ICD-10-CM | POA: Diagnosis not present

## 2020-08-20 DIAGNOSIS — R309 Painful micturition, unspecified: Secondary | ICD-10-CM | POA: Diagnosis not present

## 2020-08-20 DIAGNOSIS — R350 Frequency of micturition: Secondary | ICD-10-CM | POA: Diagnosis not present

## 2020-09-11 DIAGNOSIS — N390 Urinary tract infection, site not specified: Secondary | ICD-10-CM | POA: Diagnosis not present

## 2020-09-24 DIAGNOSIS — N3020 Other chronic cystitis without hematuria: Secondary | ICD-10-CM | POA: Diagnosis not present

## 2020-09-24 DIAGNOSIS — R8271 Bacteriuria: Secondary | ICD-10-CM | POA: Diagnosis not present

## 2020-09-24 DIAGNOSIS — N200 Calculus of kidney: Secondary | ICD-10-CM | POA: Diagnosis not present

## 2020-09-27 DIAGNOSIS — N200 Calculus of kidney: Secondary | ICD-10-CM | POA: Diagnosis not present

## 2020-10-17 DIAGNOSIS — R310 Gross hematuria: Secondary | ICD-10-CM | POA: Diagnosis not present

## 2020-10-17 DIAGNOSIS — N200 Calculus of kidney: Secondary | ICD-10-CM | POA: Diagnosis not present

## 2020-10-17 DIAGNOSIS — N3020 Other chronic cystitis without hematuria: Secondary | ICD-10-CM | POA: Diagnosis not present

## 2020-10-17 DIAGNOSIS — N3941 Urge incontinence: Secondary | ICD-10-CM | POA: Diagnosis not present

## 2020-10-21 DIAGNOSIS — U071 COVID-19: Secondary | ICD-10-CM | POA: Diagnosis not present

## 2020-10-21 DIAGNOSIS — J45909 Unspecified asthma, uncomplicated: Secondary | ICD-10-CM | POA: Diagnosis not present

## 2020-11-21 DIAGNOSIS — R110 Nausea: Secondary | ICD-10-CM | POA: Diagnosis not present

## 2020-11-21 DIAGNOSIS — R509 Fever, unspecified: Secondary | ICD-10-CM | POA: Diagnosis not present

## 2020-11-21 DIAGNOSIS — R1110 Vomiting, unspecified: Secondary | ICD-10-CM | POA: Diagnosis not present

## 2020-11-21 DIAGNOSIS — R197 Diarrhea, unspecified: Secondary | ICD-10-CM | POA: Diagnosis not present

## 2020-12-19 DIAGNOSIS — J45909 Unspecified asthma, uncomplicated: Secondary | ICD-10-CM | POA: Diagnosis not present

## 2020-12-19 DIAGNOSIS — J069 Acute upper respiratory infection, unspecified: Secondary | ICD-10-CM | POA: Diagnosis not present

## 2020-12-23 DIAGNOSIS — I10 Essential (primary) hypertension: Secondary | ICD-10-CM | POA: Diagnosis not present

## 2020-12-23 DIAGNOSIS — R7303 Prediabetes: Secondary | ICD-10-CM | POA: Diagnosis not present

## 2020-12-30 ENCOUNTER — Other Ambulatory Visit: Payer: Self-pay | Admitting: Family Medicine

## 2020-12-30 DIAGNOSIS — I10 Essential (primary) hypertension: Secondary | ICD-10-CM | POA: Diagnosis not present

## 2020-12-30 DIAGNOSIS — R7401 Elevation of levels of liver transaminase levels: Secondary | ICD-10-CM | POA: Diagnosis not present

## 2020-12-30 DIAGNOSIS — F419 Anxiety disorder, unspecified: Secondary | ICD-10-CM | POA: Diagnosis not present

## 2020-12-30 DIAGNOSIS — R002 Palpitations: Secondary | ICD-10-CM | POA: Diagnosis not present

## 2020-12-30 DIAGNOSIS — Z1231 Encounter for screening mammogram for malignant neoplasm of breast: Secondary | ICD-10-CM

## 2021-02-03 DIAGNOSIS — R632 Polyphagia: Secondary | ICD-10-CM | POA: Diagnosis not present

## 2021-02-03 DIAGNOSIS — I10 Essential (primary) hypertension: Secondary | ICD-10-CM | POA: Diagnosis not present

## 2021-02-03 DIAGNOSIS — R7303 Prediabetes: Secondary | ICD-10-CM | POA: Diagnosis not present

## 2021-02-07 IMAGING — DX DG CHEST 1V PORT
1 series · 1 of 1 positions shown · non-contrast
Comparison: 03/13/2012

CLINICAL DATA: Patient complains of chest pain.

EXAM:
PORTABLE CHEST 1 VIEW

[chest ap]
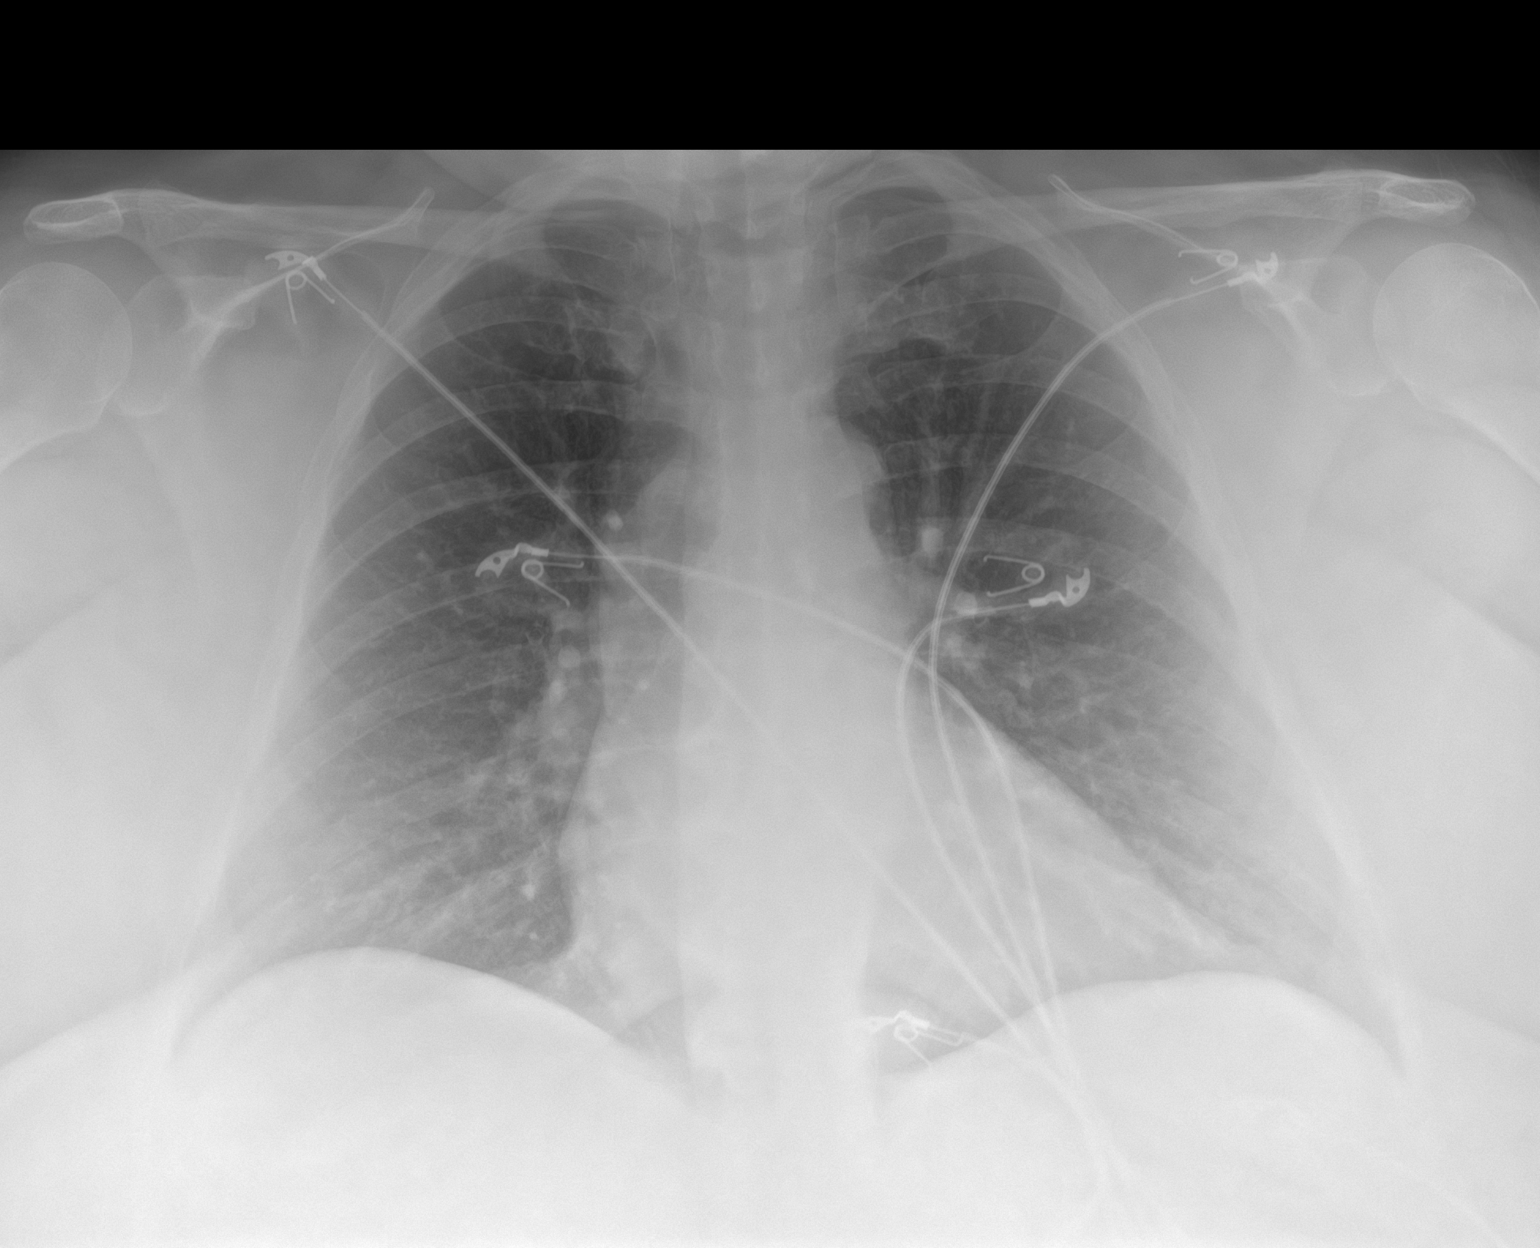

[1 of 1 positions shown; findings below may reference images not displayed]

FINDINGS: The heart size and mediastinal contours are within normal limits.
Both lungs are clear. The visualized skeletal structures are
unremarkable.
IMPRESSION: No active disease.

## 2021-03-06 DIAGNOSIS — M542 Cervicalgia: Secondary | ICD-10-CM | POA: Diagnosis not present

## 2021-03-31 DIAGNOSIS — I10 Essential (primary) hypertension: Secondary | ICD-10-CM | POA: Diagnosis not present

## 2021-03-31 DIAGNOSIS — R7303 Prediabetes: Secondary | ICD-10-CM | POA: Diagnosis not present

## 2021-04-03 DIAGNOSIS — L03119 Cellulitis of unspecified part of limb: Secondary | ICD-10-CM | POA: Diagnosis not present

## 2021-04-07 DIAGNOSIS — R632 Polyphagia: Secondary | ICD-10-CM | POA: Diagnosis not present

## 2021-04-07 DIAGNOSIS — R7303 Prediabetes: Secondary | ICD-10-CM | POA: Diagnosis not present

## 2021-04-07 DIAGNOSIS — I10 Essential (primary) hypertension: Secondary | ICD-10-CM | POA: Diagnosis not present

## 2021-06-06 DIAGNOSIS — M170 Bilateral primary osteoarthritis of knee: Secondary | ICD-10-CM | POA: Diagnosis not present

## 2021-06-06 DIAGNOSIS — T8484XA Pain due to internal orthopedic prosthetic devices, implants and grafts, initial encounter: Secondary | ICD-10-CM | POA: Diagnosis not present

## 2021-06-06 DIAGNOSIS — M25561 Pain in right knee: Secondary | ICD-10-CM | POA: Diagnosis not present

## 2021-06-06 DIAGNOSIS — M25562 Pain in left knee: Secondary | ICD-10-CM | POA: Diagnosis not present

## 2021-06-10 DIAGNOSIS — M2550 Pain in unspecified joint: Secondary | ICD-10-CM | POA: Diagnosis not present

## 2021-06-19 DIAGNOSIS — D171 Benign lipomatous neoplasm of skin and subcutaneous tissue of trunk: Secondary | ICD-10-CM | POA: Diagnosis not present

## 2021-06-26 ENCOUNTER — Other Ambulatory Visit: Payer: Self-pay | Admitting: *Deleted

## 2021-06-26 DIAGNOSIS — D179 Benign lipomatous neoplasm, unspecified: Secondary | ICD-10-CM

## 2021-07-01 ENCOUNTER — Encounter: Payer: Self-pay | Admitting: General Surgery

## 2021-07-01 ENCOUNTER — Ambulatory Visit: Payer: BC Managed Care – PPO | Admitting: General Surgery

## 2021-07-01 VITALS — BP 114/72 | HR 73 | Temp 98.6°F | Resp 16 | Ht 64.0 in | Wt 337.0 lb

## 2021-07-01 DIAGNOSIS — D179 Benign lipomatous neoplasm, unspecified: Secondary | ICD-10-CM | POA: Diagnosis not present

## 2021-07-01 NOTE — Progress Notes (Signed)
[facility] History and Physical ? ?Reason for Referral: Lipomas, ? Dercum's disease?  ?Referring Physician: Dr. Nevada Crane ? ?Chief Complaint   ?New Patient (Initial Visit) ?  ? ? ?Heather Delgado is a 60 y.o. female.  ?HPI: Heather Delgado is a very sweet 62 yo who has chronic pain from multiple lipomas she has throughout her body and mostly on her upper arms and torso area. She thinks she probably has Dercum's disease and had been seen by Dr. Nevada Crane in the past saying she had lipomas. She feels like she has even more now but they are deeper than what Dr. Nevada Crane would expect for Dercum's disease she says. She says she first new about any lipomas years ago during a massage. She follows Dr. Rosanne Gutting, MD PhD in [place] who is the nation's expert on Dercum's per the patient. She says that she has attempted to follow the diet and avoid nightshades etc to help with her pain. She knows that she will continue to get more lipomas and that removing any could result in more forming. She just wants to get some of these areas removed to confirm that they are in fact lipomas.  ? ?Past Medical History:  ?Diagnosis Date  ? Arthritis   ? Asthma   ? Back pain   ? Diverticulitis   ? DJD (degenerative joint disease)   ? Hypertension   ? Reflux   ? Spondylolysis   ? ? ?Past Surgical History:  ?Procedure Laterality Date  ? BIOPSY  08/25/2018  ? Procedure: BIOPSY;  Surgeon: Rogene Houston, MD;  [facility]: [facility];  Service: Endoscopy;;  ? CARPAL TUNNEL RELEASE    ? CESAREAN SECTION    ? CHOLECYSTECTOMY    ? COLONOSCOPY N/A 08/25/2018  ? Procedure: COLONOSCOPY;  Surgeon: Rogene Houston, MD;  [facility]: [facility];  Service: Endoscopy;  Laterality: N/A;  1030  ? FINGER SURGERY    ? I & D EXTREMITY  12/30/2011  ? Procedure: IRRIGATION AND DEBRIDEMENT EXTREMITY;  Surgeon: Roseanne Kaufman, MD;  Location: [place];  Service: Orthopedics;  Laterality: Right;  1st finger  ? INFECTED SKIN DEBRIDEMENT  12/29/2011  ? RIGHT INDEX   ? IR  RADIOLOGIST EVAL & MGMT  03/04/2017  ? IR VERTEBROPLASTY LUMBAR BX INC UNI/BIL INC/INJECT/IMAGING  03/12/2017  ? KNEE SURGERY    ? POLYPECTOMY  08/25/2018  ? Procedure: POLYPECTOMY;  Surgeon: Rogene Houston, MD;  [facility];  Service: Endoscopy;;  ? ? ?Family History  ?Problem Relation Age of Onset  ? Stroke Maternal Grandmother   ? Heart disease Paternal Grandmother   ? Heart disease Maternal Grandfather   ? Cancer Maternal Grandfather   ? Alzheimer's disease Maternal Grandfather   ? Heart disease Paternal Grandfather   ? ? ?Social History  ? ?Tobacco Use  ? Smoking status: Never  ? Smokeless tobacco: Never  ?Vaping Use  ? Vaping Use: Never used  ?Substance Use Topics  ? Alcohol use: No  ?  Comment: occ  ? Drug use: No  ? ? ?Medications: I have reviewed the patient's current medications. ?Allergies as of 07/01/2021   ? ?   Reactions  ? Duramorph [morphine] Rash  ? "full body rash"  ? Sulfa Antibiotics Anaphylaxis, Rash  ? Triamterene Anaphylaxis, Rash  ? Gabapentin Other (See Comments)  ? Caused mouth ulcers.   ? Lyrica [pregabalin]   ? Nightmares / drunken feeling   ? Thimerosal   ? blisters  ?  Latex Rash  ? ?  ? ?  ?Medication List  ?  ? ?  ? Accurate as of Jul 01, 2021 10:42 AM. If you have any questions, ask your nurse or doctor.  ?  ?  ? ?  ? ?STOP taking these medications   ? ?Alpha-Lipoic Acid 600 MG Caps ?Stopped by: Virl Cagey, MD ?  ?BLUE-EMU SUPER STRENGTH EX ?Stopped by: Virl Cagey, MD ?  ?budesonide-formoterol 160-4.5 MCG/ACT inhaler ?Commonly known as: SYMBICORT ?Stopped by: Virl Cagey, MD ?  ?buPROPion 150 MG 24 hr tablet ?Commonly known as: WELLBUTRIN XL ?Stopped by: Virl Cagey, MD ?  ?Magnesium 400 MG Tabs ?Stopped by: Virl Cagey, MD ?  ?oxyCODONE-acetaminophen 5-325 MG tablet ?Commonly known as: PERCOCET/ROXICET ?Stopped by: Virl Cagey, MD ?  ?REFRESH OP ?Stopped by: Virl Cagey, MD ?  ?RESVERATROL PO ?Stopped by: Virl Cagey, MD ?   ?SALONPAS PAIN RELIEF PATCH EX ?Stopped by: Virl Cagey, MD ?  ?SPIRULINA PO ?Stopped by: Virl Cagey, MD ?  ? ?  ? ?TAKE these medications   ? ?acetaminophen 500 MG tablet ?Commonly known as: TYLENOL ?Take 1,000 mg by mouth every 6 (six) hours as needed for moderate pain or headache. ?  ?albuterol 108 (90 Base) MCG/ACT inhaler ?Commonly known as: VENTOLIN HFA ?Inhale 2 puffs into the lungs every 6 (six) hours as needed for wheezing or shortness of breath. ?  ?aspirin EC 81 MG tablet ?Take 81 mg by mouth daily. Swallow whole. ?  ?atenolol 25 MG tablet ?Commonly known as: TENORMIN ?Take 25-50 mg by mouth See admin instructions. Take 25 mg every night, on days when experiencing palpitations take 50 mg instead ?  ?cetirizine 10 MG tablet ?Commonly known as: ZYRTEC ?Take 10 mg by mouth daily. ?  ?clobetasol ointment 0.05 % ?Commonly known as: TEMOVATE ?Apply 1 application topically daily as needed (lichen sclerosus). ?  ?diclofenac 75 MG EC tablet ?Commonly known as: VOLTAREN ?Take 1 tablet by mouth 2 (two) times daily. ?  ?DULoxetine 60 MG capsule ?Commonly known as: CYMBALTA ?Take 60 mg by mouth at bedtime. ?  ?EPINEPHrine 0.3 mg/0.3 mL Soaj injection ?Commonly known as: EPI-PEN ?Inject 0.3 mg into the muscle daily as needed for anaphylaxis. ?  ?fluticasone 50 MCG/ACT nasal spray ?Commonly known as: FLONASE ?Place 1 spray into both nostrils daily as needed for allergies or rhinitis. ?  ?furosemide 40 MG tablet ?Commonly known as: LASIX ?Take 20 mg by mouth daily as needed for edema. ?  ?Gemtesa 75 MG Tabs ?Generic drug: Vibegron ?Take 1 tablet by mouth daily. ?  ?lisinopril 40 MG tablet ?Commonly known as: ZESTRIL ?Take 40 mg by mouth daily. ?What changed: Another medication with the same name was removed. Continue taking this medication, and follow the directions you see here. ?Changed by: Virl Cagey, MD ?  ?LORazepam 0.5 MG tablet ?Commonly known as: ATIVAN ?Take by mouth. ?  ?montelukast 10 MG  tablet ?Commonly known as: SINGULAIR ?Take 10 mg by mouth at bedtime. ?  ?PROBIOTIC DAILY PO ?Take 1 tablet by mouth daily as needed (upset stomach). ?  ?traMADol 50 MG tablet ?Commonly known as: ULTRAM ?Take 50 mg by mouth daily as needed for moderate pain. ?  ?Turmeric Curcumin 500 MG Caps ?Take 500 mg by mouth at bedtime. ?  ?Vitamin D 50 MCG (2000 UT) tablet ?Take 4,000 Units by mouth at bedtime. ?  ? ?  ? ? ? ?ROS:  ?  A comprehensive review of systems was negative except for: Musculoskeletal: positive for chronic pain at lipoma sites, sensitivity, pain in the right arm and upper shoulder ?Neurological: positive for chronic pain ? ?Blood pressure 114/72, pulse 73, temperature 98.6 ?F (37 ?C), temperature source Oral, resp. rate 16, height '5\' 4"'$  (1.626 m), weight (!) 337 lb (152.9 kg), SpO2 93 %. ?Physical Exam ?Vitals reviewed.  ?Constitutional:   ?   Appearance: She is obese.  ?HENT:  ?   Head: Normocephalic.  ?   Nose: Nose normal.  ?Eyes:  ?   Extraocular Movements: Extraocular movements intact.  ?Cardiovascular:  ?   Rate and Rhythm: Normal rate and regular rhythm.  ?Pulmonary:  ?   Effort: Pulmonary effort is normal.  ?   Breath sounds: Normal breath sounds.  ?Abdominal:  ?   General: There is no distension.  ?   Palpations: Abdomen is soft.  ?   Comments: Firm nodule in the epigastric region tender about 1cm in size   ?Musculoskeletal:     ?   General: No swelling.  ?   Cervical back: Normal range of motion.  ?   Comments: Firm nodule in the right upper arm area, nonmobile, firm nodule in the lower back on left, tender, 3cm in size, left arm 1cm firm noduble, mobile tender  ?Skin: ?   General: Skin is warm.  ?Neurological:  ?   General: No focal deficit present.  ?   Mental Status: She is alert.  ?Psychiatric:     ?   Mood and Affect: Mood normal.  ? ? ?Results: ?None ? ? ?Assessment & Plan:  ?MADISUN HARGROVE is a 61 y.o. female with multiple lipomas and concern for possible Dercum's disease. She wants to have  a few of these removed to get a diagnosis of lipoma and also to see if it helps with pain. Discussed risk of bleeding, infection, recurrence with more lipomas, continued or more pina, and need for likely some se

## 2021-07-01 NOTE — Patient Instructions (Signed)
Lipoma Removal ? ?Lipoma removal is a surgical procedure to remove a lipoma, which is a noncancerous (benign) tumor that is made up of fat cells. Most lipomas are small and painless and do not require treatment. They can form in many areas of the body but are most common under the skin of the back, arms, shoulders, buttocks, and thighs. ?You may need lipoma removal if you have a lipoma that is large, growing, or causing discomfort. Lipoma removal may also be done for cosmetic reasons. ?Tell a health care provider about: ?Any allergies you have. ?All medicines you are taking, including vitamins, herbs, eye drops, creams, and over-the-counter medicines. ?Any problems you or family members have had with anesthetic medicines. ?Any bleeding problems you have. ?Any surgeries you have had. ?Any medical conditions you have. ?Whether you are pregnant or may be pregnant. ?What are the risks? ?Generally, this is a safe procedure. However, problems may occur, including: ?Infection. ?Bleeding. ?Scarring. ?Allergic reactions to medicines. ?Damage to nearby structures or organs, such as damage to nerves or blood vessels near the lipoma. ?What happens before the procedure? ?When to Stop Eating and Drinking ?Medicines ?Ask your health care provider about: ?Changing or stopping your regular medicines. This is especially important if you are taking diabetes medicines or blood thinners. ?Taking medicines such as aspirin and ibuprofen. These medicines can thin your blood. Do not take these medicines unless your health care provider tells you to take them. ?Taking over-the-counter medicines, vitamins, herbs, and supplements. ?General instructions ?You will have a physical exam. Your health care provider will check the size of the lipoma and whether it can be removed easily. ?You may have a biopsy and imaging tests, such as X-rays, a CT scan, and an MRI. ?Do not use any products that contain nicotine or tobacco for at least 4 weeks before  the procedure. These products include cigarettes, chewing tobacco, and vaping devices, such as e-cigarettes. If you need help quitting, ask your health care provider. ?Ask your health care provider: ?How your surgery site will be marked. ?What steps will be taken to help prevent infection. These may include: ?Washing skin with a germ-killing soap. ?Taking antibiotic medicine. ?If you will be going home right after the procedure, plan to have a responsible adult: ?Take you home from the hospital or clinic. You will not be allowed to drive. ?Care for you for the time you are told. ?What happens during the procedure? ? ?An IV will be inserted into one of your veins. ?You will be given one or more of the following: ?A medicine to help you relax (sedative). ?A medicine to numb the area (local anesthetic). ?A medicine to make you fall asleep (general anesthetic). ?A medicine that is injected into an area of your body to numb everything below the injection site (regional anesthetic). ?An incision will be made into the skin over the lipoma or very near the lipoma. The incision may be made in a natural skin line or crease. ?Tissues, nerves, and blood vessels near the lipoma will be moved out of the way. ?The lipoma and the capsule that surrounds it will be separated from the surrounding tissues. ?The lipoma will be removed. ?The incision may be closed with stitches (sutures). ?A bandage (dressing) will be placed over the incision. ?The procedure may vary among health care providers and hospitals. ?What happens after the procedure? ?Your blood pressure, heart rate, breathing rate, and blood oxygen level will be monitored until you leave the hospital or clinic. ?If  you were prescribed an antibiotic medicine, use it as told by your health care provider. Do not stop using the antibiotic even if you start to feel better. ?If you were given a sedative during the procedure, it can affect you for several hours. Do not drive or  operate machinery until your health care provider says that it is safe. ?Where to find more information ?OrthoInfo: orthoinfo.aaos.org ?Summary ?Before the procedure, follow instructions from your health care provider about eating and drinking, and changing or stopping your regular medicines. This is especially important if you are taking diabetes medicines or blood thinners. ?After the lipoma is removed, the incision may be closed with stitches (sutures) and covered with a bandage (dressing). ?If you were given a sedative during the procedure, it can affect you for several hours. Do not drive or operate machinery until your health care provider says that it is safe. ?This information is not intended to replace advice given to you by your health care provider. Make sure you discuss any questions you have with your health care provider. ?Document Revised: 02/28/2021 Document Reviewed: 02/28/2021 ?Elsevier Patient Education ? [place]. ? ?

## 2021-07-02 NOTE — H&P (Signed)
[facility] History and Physical ? ?Reason for Referral: Lipomas, ? Dercum's disease?  ?Referring Physician: Dr. Nevada Crane ? ?Chief Complaint   ?New Patient (Initial Visit) ?  ? ? ?Heather Delgado is a 61 y.o. female.  ?HPI: Heather Delgado is a very sweet 61 yo who has chronic pain from multiple lipomas she has throughout her body and mostly on her upper arms and torso area. She thinks she probably has Dercum's disease and had been seen by Dr. Nevada Crane in the past saying she had lipomas. She feels like she has even more now but they are deeper than what Dr. Nevada Crane would expect for Dercum's disease she says. She says she first new about any lipomas years ago during a massage. She follows Dr. Rosanne Gutting, MD PhD in [place] who is the nation's expert on Dercum's per the patient. She says that she has attempted to follow the diet and avoid nightshades etc to help with her pain. She knows that she will continue to get more lipomas and that removing any could result in more forming. She just wants to get some of these areas removed to confirm that they are in fact lipomas.  ? ?Past Medical History:  ?Diagnosis Date  ? Arthritis   ? Asthma   ? Back pain   ? Diverticulitis   ? DJD (degenerative joint disease)   ? Hypertension   ? Reflux   ? Spondylolysis   ? ? ?Past Surgical History:  ?Procedure Laterality Date  ? BIOPSY  08/25/2018  ? Procedure: BIOPSY;  Surgeon: Rogene Houston, MD;  [facility];  Service: Endoscopy;;  ? CARPAL TUNNEL RELEASE    ? CESAREAN SECTION    ? CHOLECYSTECTOMY    ? COLONOSCOPY N/A 08/25/2018  ? Procedure: COLONOSCOPY;  Surgeon: Rogene Houston, MD;  [facility];  Service: Endoscopy;  Laterality: N/A;  1030  ? FINGER SURGERY    ? I & D EXTREMITY  12/30/2011  ? Procedure: IRRIGATION AND DEBRIDEMENT EXTREMITY;  Surgeon: Roseanne Kaufman, MD;  Location: [place];  Service: Orthopedics;  Laterality: Right;  1st finger  ? INFECTED SKIN DEBRIDEMENT  12/29/2011  ? RIGHT INDEX   ? IR  RADIOLOGIST EVAL & MGMT  03/04/2017  ? IR VERTEBROPLASTY LUMBAR BX INC UNI/BIL INC/INJECT/IMAGING  03/12/2017  ? KNEE SURGERY    ? POLYPECTOMY  08/25/2018  ? Procedure: POLYPECTOMY;  Surgeon: Rogene Houston, MD;  [facility];  Service: Endoscopy;;  ? ? ?Family History  ?Problem Relation Age of Onset  ? Stroke Maternal Grandmother   ? Heart disease Paternal Grandmother   ? Heart disease Maternal Grandfather   ? Cancer Maternal Grandfather   ? Alzheimer's disease Maternal Grandfather   ? Heart disease Paternal Grandfather   ? ? ?Social History  ? ?Tobacco Use  ? Smoking status: Never  ? Smokeless tobacco: Never  ?Vaping Use  ? Vaping Use: Never used  ?Substance Use Topics  ? Alcohol use: No  ?  Comment: occ  ? Drug use: No  ? ? ?Medications: I have reviewed the patient's current medications. ?Allergies as of 07/01/2021   ? ?   Reactions  ? Duramorph [morphine] Rash  ? "full body rash"  ? Sulfa Antibiotics Anaphylaxis, Rash  ? Triamterene Anaphylaxis, Rash  ? Gabapentin Other (See Comments)  ? Caused mouth ulcers.   ? Lyrica [pregabalin]   ? Nightmares / drunken feeling   ? Thimerosal   ? blisters  ?  Latex Rash  ? ?  ? ?  ?Medication List  ?  ? ?  ? Accurate as of Jul 01, 2021 10:42 AM. If you have any questions, ask your nurse or doctor.  ?  ?  ? ?  ? ?STOP taking these medications   ? ?Alpha-Lipoic Acid 600 MG Caps ?Stopped by: Virl Cagey, MD ?  ?BLUE-EMU SUPER STRENGTH EX ?Stopped by: Virl Cagey, MD ?  ?budesonide-formoterol 160-4.5 MCG/ACT inhaler ?Commonly known as: SYMBICORT ?Stopped by: Virl Cagey, MD ?  ?buPROPion 150 MG 24 hr tablet ?Commonly known as: WELLBUTRIN XL ?Stopped by: Virl Cagey, MD ?  ?Magnesium 400 MG Tabs ?Stopped by: Virl Cagey, MD ?  ?oxyCODONE-acetaminophen 5-325 MG tablet ?Commonly known as: PERCOCET/ROXICET ?Stopped by: Virl Cagey, MD ?  ?REFRESH OP ?Stopped by: Virl Cagey, MD ?  ?RESVERATROL PO ?Stopped by: Virl Cagey, MD ?   ?SALONPAS PAIN RELIEF PATCH EX ?Stopped by: Virl Cagey, MD ?  ?SPIRULINA PO ?Stopped by: Virl Cagey, MD ?  ? ?  ? ?TAKE these medications   ? ?acetaminophen 500 MG tablet ?Commonly known as: TYLENOL ?Take 1,000 mg by mouth every 6 (six) hours as needed for moderate pain or headache. ?  ?albuterol 108 (90 Base) MCG/ACT inhaler ?Commonly known as: VENTOLIN HFA ?Inhale 2 puffs into the lungs every 6 (six) hours as needed for wheezing or shortness of breath. ?  ?aspirin EC 81 MG tablet ?Take 81 mg by mouth daily. Swallow whole. ?  ?atenolol 25 MG tablet ?Commonly known as: TENORMIN ?Take 25-50 mg by mouth See admin instructions. Take 25 mg every night, on days when experiencing palpitations take 50 mg instead ?  ?cetirizine 10 MG tablet ?Commonly known as: ZYRTEC ?Take 10 mg by mouth daily. ?  ?clobetasol ointment 0.05 % ?Commonly known as: TEMOVATE ?Apply 1 application topically daily as needed (lichen sclerosus). ?  ?diclofenac 75 MG EC tablet ?Commonly known as: VOLTAREN ?Take 1 tablet by mouth 2 (two) times daily. ?  ?DULoxetine 60 MG capsule ?Commonly known as: CYMBALTA ?Take 60 mg by mouth at bedtime. ?  ?EPINEPHrine 0.3 mg/0.3 mL Soaj injection ?Commonly known as: EPI-PEN ?Inject 0.3 mg into the muscle daily as needed for anaphylaxis. ?  ?fluticasone 50 MCG/ACT nasal spray ?Commonly known as: FLONASE ?Place 1 spray into both nostrils daily as needed for allergies or rhinitis. ?  ?furosemide 40 MG tablet ?Commonly known as: LASIX ?Take 20 mg by mouth daily as needed for edema. ?  ?Gemtesa 75 MG Tabs ?Generic drug: Vibegron ?Take 1 tablet by mouth daily. ?  ?lisinopril 40 MG tablet ?Commonly known as: ZESTRIL ?Take 40 mg by mouth daily. ?What changed: Another medication with the same name was removed. Continue taking this medication, and follow the directions you see here. ?Changed by: Virl Cagey, MD ?  ?LORazepam 0.5 MG tablet ?Commonly known as: ATIVAN ?Take by mouth. ?  ?montelukast 10 MG  tablet ?Commonly known as: SINGULAIR ?Take 10 mg by mouth at bedtime. ?  ?PROBIOTIC DAILY PO ?Take 1 tablet by mouth daily as needed (upset stomach). ?  ?traMADol 50 MG tablet ?Commonly known as: ULTRAM ?Take 50 mg by mouth daily as needed for moderate pain. ?  ?Turmeric Curcumin 500 MG Caps ?Take 500 mg by mouth at bedtime. ?  ?Vitamin D 50 MCG (2000 UT) tablet ?Take 4,000 Units by mouth at bedtime. ?  ? ?  ? ? ? ?ROS:  ?  A comprehensive review of systems was negative except for: Musculoskeletal: positive for chronic pain at lipoma sites, sensitivity, pain in the right arm and upper shoulder ?Neurological: positive for chronic pain ? ?Blood pressure 114/72, pulse 73, temperature 98.6 ?F (37 ?C), temperature source Oral, resp. rate 16, height '5\' 4"'$  (1.626 m), weight (!) 337 lb (152.9 kg), SpO2 93 %. ?Physical Exam ?Vitals reviewed.  ?Constitutional:   ?   Appearance: She is obese.  ?HENT:  ?   Head: Normocephalic.  ?   Nose: Nose normal.  ?Eyes:  ?   Extraocular Movements: Extraocular movements intact.  ?Cardiovascular:  ?   Rate and Rhythm: Normal rate and regular rhythm.  ?Pulmonary:  ?   Effort: Pulmonary effort is normal.  ?   Breath sounds: Normal breath sounds.  ?Abdominal:  ?   General: There is no distension.  ?   Palpations: Abdomen is soft.  ?   Comments: Firm nodule in the epigastric region tender about 1cm in size   ?Musculoskeletal:     ?   General: No swelling.  ?   Cervical back: Normal range of motion.  ?   Comments: Firm nodule in the right upper arm area, nonmobile, firm nodule in the lower back on left, tender, 3cm in size, left arm 1cm firm noduble, mobile tender  ?Skin: ?   General: Skin is warm.  ?Neurological:  ?   General: No focal deficit present.  ?   Mental Status: She is alert.  ?Psychiatric:     ?   Mood and Affect: Mood normal.  ? ? ?Results: ?None ? ? ?Assessment & Plan:  ?Heather Delgado is a 61 y.o. female with multiple lipomas and concern for possible Dercum's disease. She wants to have  a few of these removed to get a diagnosis of lipoma and also to see if it helps with pain. Discussed risk of bleeding, infection, recurrence with more lipomas, continued or more pina, and need for likely some se

## 2021-07-04 NOTE — Patient Instructions (Signed)
? Your procedure is scheduled on: 07/09/2021 ? Report to [facility] at 7:30    AM. ? Call this number if you have problems the morning of surgery: [phone] ? ? Remember: ? ? Do not Eat or Drink after midnight  ? ?      No Smoking the morning of surgery ? : ? Take these medicines the morning of surgery with A SIP OF WATER: Atenolol and cymbalta ?            Xyzal, Ativan, Zyrtec, Tramadol, and/or Vibegron if needed ?         ?            Use inhalers if needed ? ? Do not wear jewelry, make-up or nail polish. ? Do not wear lotions, powders, or perfumes. You may wear deodorant. ? Do not shave 48 hours prior to surgery. Men may shave face and neck. ? Do not bring valuables to the hospital. ? Contacts, dentures or bridgework may not be worn into surgery. ? Leave suitcase in the car. After surgery it may be brought to your room. ? For patients admitted to the hospital, checkout time is 11:00 AM the day of discharge. ? ? Patients discharged the day of surgery will not be allowed to drive home. ?  ? Special Instructions: Shower using CHG night before surgery and shower the day of surgery use CHG.  Use special wash - you have one bottle of CHG for all showers.  You should use approximately 1/2 of the bottle for each shower.  ?How to Use Chlorhexidine for Bathing ?Chlorhexidine gluconate (CHG) is a germ-killing (antiseptic) solution that is used to clean the skin. It can get rid of the bacteria that normally live on the skin and can keep them away for about 24 hours. To clean your skin with CHG, you may be given: ?A CHG solution to use in the shower or as part of a sponge bath. ?A prepackaged cloth that contains CHG. ?Cleaning your skin with CHG may help lower the risk for infection: ?While you are staying in the intensive care unit of the hospital. ?If you have a vascular access, such as a central line, to provide short-term or long-term access to your veins. ?If you have a catheter to drain urine from your  bladder. ?If you are on a ventilator. A ventilator is a machine that helps you breathe by moving air in and out of your lungs. ?After surgery. ?What are the risks? ?Risks of using CHG include: ?A skin reaction. ?Hearing loss, if CHG gets in your ears and you have a perforated eardrum. ?Eye injury, if CHG gets in your eyes and is not rinsed out. ?The CHG product catching fire. ?Make sure that you avoid smoking and flames after applying CHG to your skin. ?Do not use CHG: ?If you have a chlorhexidine allergy or have previously reacted to chlorhexidine. ?On babies younger than 26 months of age. ?How to use CHG solution ?Use CHG only as told by your health care provider, and follow the instructions on the label. ?Use the full amount of CHG as directed. Usually, this is one bottle. ?During a shower ?Follow these steps when using CHG solution during a shower (unless your health care provider gives you different instructions): ?Start the shower. ?Use your normal soap and shampoo to wash your face and hair. ?Turn off the shower or move out of the shower stream. ?Pour the CHG onto a clean washcloth. Do not  use any type of brush or rough-edged sponge. ?Starting at your neck, lather your body down to your toes. Make sure you follow these instructions: ?If you will be having surgery, pay special attention to the part of your body where you will be having surgery. Scrub this area for at least 1 minute. ?Do not use CHG on your head or face. If the solution gets into your ears or eyes, rinse them well with water. ?Avoid your genital area. ?Avoid any areas of skin that have broken skin, cuts, or scrapes. ?Scrub your back and under your arms. Make sure to wash skin folds. ?Let the lather sit on your skin for 1-2 minutes or as long as told by your health care provider. ?Thoroughly rinse your entire body in the shower. Make sure that all body creases and crevices are rinsed well. ?Dry off with a clean towel. Do not put any substances on  your body afterward--such as powder, lotion, or perfume--unless you are told to do so by your health care provider. Only use lotions that are recommended by the manufacturer. ?Put on clean clothes or pajamas. ?If it is the night before your surgery, sleep in clean sheets. ? ?During a sponge bath ?Follow these steps when using CHG solution during a sponge bath (unless your health care provider gives you different instructions): ?Use your normal soap and shampoo to wash your face and hair. ?Pour the CHG onto a clean washcloth. ?Starting at your neck, lather your body down to your toes. Make sure you follow these instructions: ?If you will be having surgery, pay special attention to the part of your body where you will be having surgery. Scrub this area for at least 1 minute. ?Do not use CHG on your head or face. If the solution gets into your ears or eyes, rinse them well with water. ?Avoid your genital area. ?Avoid any areas of skin that have broken skin, cuts, or scrapes. ?Scrub your back and under your arms. Make sure to wash skin folds. ?Let the lather sit on your skin for 1-2 minutes or as long as told by your health care provider. ?Using a different clean, wet washcloth, thoroughly rinse your entire body. Make sure that all body creases and crevices are rinsed well. ?Dry off with a clean towel. Do not put any substances on your body afterward--such as powder, lotion, or perfume--unless you are told to do so by your health care provider. Only use lotions that are recommended by the manufacturer. ?Put on clean clothes or pajamas. ?If it is the night before your surgery, sleep in clean sheets. ?How to use CHG prepackaged cloths ?Only use CHG cloths as told by your health care provider, and follow the instructions on the label. ?Use the CHG cloth on clean, dry skin. ?Do not use the CHG cloth on your head or face unless your health care provider tells you to. ?When washing with the CHG cloth: ?Avoid your genital  area. ?Avoid any areas of skin that have broken skin, cuts, or scrapes. ?Before surgery ?Follow these steps when using a CHG cloth to clean before surgery (unless your health care provider gives you different instructions): ?Using the CHG cloth, vigorously scrub the part of your body where you will be having surgery. Scrub using a back-and-forth motion for 3 minutes. The area on your body should be completely wet with CHG when you are done scrubbing. ?Do not rinse. Discard the cloth and let the area air-dry. Do not put any substances  on the area afterward, such as powder, lotion, or perfume. ?Put on clean clothes or pajamas. ?If it is the night before your surgery, sleep in clean sheets. ? ?For general bathing ?Follow these steps when using CHG cloths for general bathing (unless your health care provider gives you different instructions). ?Use a separate CHG cloth for each area of your body. Make sure you wash between any folds of skin and between your fingers and toes. Wash your body in the following order, switching to a new cloth after each step: ?The front of your neck, shoulders, and chest. ?Both of your arms, under your arms, and your hands. ?Your stomach and groin area, avoiding the genitals. ?Your right leg and foot. ?Your left leg and foot. ?The back of your neck, your back, and your buttocks. ?Do not rinse. Discard the cloth and let the area air-dry. Do not put any substances on your body afterward--such as powder, lotion, or perfume--unless you are told to do so by your health care provider. Only use lotions that are recommended by the manufacturer. ?Put on clean clothes or pajamas. ?Contact a health care provider if: ?Your skin gets irritated after scrubbing. ?You have questions about using your solution or cloth. ?You swallow any chlorhexidine. Call your local poison control center (1-(480) [phone] in the [place] ?Get help right away if: ?Your eyes itch badly, or they become very red or swollen. ?Your  skin itches badly and is red or swollen. ?Your hearing changes. ?You have trouble seeing. ?You have swelling or tingling in your mouth or throat. ?You have trouble breathing. ?These symptoms may represent

## 2021-07-07 ENCOUNTER — Encounter (HOSPITAL_COMMUNITY): Payer: Self-pay

## 2021-07-07 ENCOUNTER — Encounter (HOSPITAL_COMMUNITY)
Admission: RE | Admit: 2021-07-07 | Discharge: 2021-07-07 | Disposition: A | Discharge status: Home / Self Care | Payer: BC Managed Care – PPO | Source: Ambulatory Visit | Attending: General Surgery | Admitting: General Surgery

## 2021-07-07 VITALS — BP 123/72 | HR 73 | Temp 98.6°F | Resp 18 | Ht 64.0 in | Wt 337.0 lb

## 2021-07-07 DIAGNOSIS — D171 Benign lipomatous neoplasm of skin and subcutaneous tissue of trunk: Secondary | ICD-10-CM | POA: Diagnosis not present

## 2021-07-07 DIAGNOSIS — Z79899 Other long term (current) drug therapy: Secondary | ICD-10-CM | POA: Insufficient documentation

## 2021-07-07 DIAGNOSIS — F419 Anxiety disorder, unspecified: Secondary | ICD-10-CM | POA: Diagnosis not present

## 2021-07-07 DIAGNOSIS — J45909 Unspecified asthma, uncomplicated: Secondary | ICD-10-CM | POA: Diagnosis not present

## 2021-07-07 DIAGNOSIS — Z01818 Encounter for other preprocedural examination: Secondary | ICD-10-CM | POA: Insufficient documentation

## 2021-07-07 DIAGNOSIS — I10 Essential (primary) hypertension: Secondary | ICD-10-CM | POA: Diagnosis not present

## 2021-07-07 DIAGNOSIS — D1722 Benign lipomatous neoplasm of skin and subcutaneous tissue of left arm: Secondary | ICD-10-CM | POA: Diagnosis not present

## 2021-07-07 DIAGNOSIS — F32A Depression, unspecified: Secondary | ICD-10-CM | POA: Diagnosis not present

## 2021-07-07 DIAGNOSIS — Z6841 Body Mass Index (BMI) 40.0 and over, adult: Secondary | ICD-10-CM | POA: Diagnosis not present

## 2021-07-07 HISTORY — DX: Anxiety disorder, unspecified: F41.9

## 2021-07-07 HISTORY — DX: Nausea with vomiting, unspecified: R11.2

## 2021-07-07 HISTORY — DX: Other specified postprocedural states: Z98.890

## 2021-07-07 HISTORY — DX: Other complications of anesthesia, initial encounter: T88.59XA

## 2021-07-07 HISTORY — DX: Irritable bowel syndrome, unspecified: K58.9

## 2021-07-07 HISTORY — DX: Ventricular premature depolarization: I49.3

## 2021-07-07 HISTORY — DX: Depression, unspecified: F32.A

## 2021-07-07 HISTORY — DX: Cardiac arrhythmia, unspecified: I49.9

## 2021-07-07 LAB — BASIC METABOLIC PANEL
Anion gap: 5 (ref 5–15)
BUN: 18 mg/dL (ref 6–20)
CO2: 27 mmol/L (ref 22–32)
Calcium: 9.6 mg/dL (ref 8.9–10.3)
Chloride: 107 mmol/L (ref 98–111)
Creatinine, Ser: 0.59 mg/dL (ref 0.44–1.00)
GFR, Estimated: >60 mL/min (ref >60)
Glucose, Bld: 126 mg/dL — ABNORMAL HIGH (ref 70–99)
Potassium: 4.2 mmol/L (ref 3.5–5.1)
Sodium: 139 mmol/L (ref 135–145)

## 2021-07-08 DIAGNOSIS — M5442 Lumbago with sciatica, left side: Secondary | ICD-10-CM | POA: Diagnosis not present

## 2021-07-09 ENCOUNTER — Ambulatory Visit (HOSPITAL_COMMUNITY)
Admission: RE | Admit: 2021-07-09 | Discharge: 2021-07-09 | Disposition: A | Discharge status: Home / Self Care | Payer: BC Managed Care – PPO | Source: Ambulatory Visit | Attending: General Surgery | Admitting: General Surgery

## 2021-07-09 ENCOUNTER — Ambulatory Visit (HOSPITAL_COMMUNITY): Payer: BC Managed Care – PPO | Admitting: Anesthesiology

## 2021-07-09 ENCOUNTER — Encounter (HOSPITAL_COMMUNITY): Payer: Self-pay | Admitting: General Surgery

## 2021-07-09 ENCOUNTER — Other Ambulatory Visit: Payer: Self-pay

## 2021-07-09 ENCOUNTER — Encounter (HOSPITAL_COMMUNITY)
Admission: RE | Disposition: A | Discharge status: Home / Self Care | Payer: Self-pay | Source: Ambulatory Visit | Attending: General Surgery

## 2021-07-09 DIAGNOSIS — D1722 Benign lipomatous neoplasm of skin and subcutaneous tissue of left arm: Secondary | ICD-10-CM | POA: Insufficient documentation

## 2021-07-09 DIAGNOSIS — I10 Essential (primary) hypertension: Secondary | ICD-10-CM | POA: Diagnosis not present

## 2021-07-09 DIAGNOSIS — Z79899 Other long term (current) drug therapy: Secondary | ICD-10-CM | POA: Diagnosis not present

## 2021-07-09 DIAGNOSIS — I493 Ventricular premature depolarization: Secondary | ICD-10-CM | POA: Diagnosis not present

## 2021-07-09 DIAGNOSIS — J45909 Unspecified asthma, uncomplicated: Secondary | ICD-10-CM | POA: Diagnosis not present

## 2021-07-09 DIAGNOSIS — Z6841 Body Mass Index (BMI) 40.0 and over, adult: Secondary | ICD-10-CM | POA: Insufficient documentation

## 2021-07-09 DIAGNOSIS — F419 Anxiety disorder, unspecified: Secondary | ICD-10-CM | POA: Diagnosis not present

## 2021-07-09 DIAGNOSIS — D171 Benign lipomatous neoplasm of skin and subcutaneous tissue of trunk: Secondary | ICD-10-CM | POA: Insufficient documentation

## 2021-07-09 DIAGNOSIS — D179 Benign lipomatous neoplasm, unspecified: Secondary | ICD-10-CM

## 2021-07-09 DIAGNOSIS — R1032 Left lower quadrant pain: Secondary | ICD-10-CM

## 2021-07-09 DIAGNOSIS — F32A Depression, unspecified: Secondary | ICD-10-CM | POA: Insufficient documentation

## 2021-07-09 DIAGNOSIS — K625 Hemorrhage of anus and rectum: Secondary | ICD-10-CM

## 2021-07-09 DIAGNOSIS — R195 Other fecal abnormalities: Secondary | ICD-10-CM

## 2021-07-09 HISTORY — PX: MASS EXCISION: SHX2000

## 2021-07-09 HISTORY — PX: LIPOMA EXCISION: SHX5283

## 2021-07-09 SURGERY — EXCISION LIPOMA
Anesthesia: General | Site: Back | Laterality: Left

## 2021-07-09 MED ORDER — CHLORHEXIDINE GLUCONATE 0.12 % MT SOLN
15.0000 mL | Freq: Once | OROMUCOSAL | Status: AC
  Administered 2021-07-09: 15 mL via OROMUCOSAL

## 2021-07-09 MED ORDER — GLYCOPYRROLATE PF 0.2 MG/ML IJ SOSY
PREFILLED_SYRINGE | INTRAMUSCULAR | Status: AC
  Filled 2021-07-09: qty 1

## 2021-07-09 MED ORDER — LIDOCAINE 2% (20 MG/ML) 5 ML SYRINGE
INTRAMUSCULAR | Status: DC | PRN
  Administered 2021-07-09: 100 mg via INTRAVENOUS

## 2021-07-09 MED ORDER — PROPOFOL 10 MG/ML IV BOLUS
INTRAVENOUS | Status: AC
  Filled 2021-07-09: qty 20

## 2021-07-09 MED ORDER — CEFAZOLIN IN SODIUM CHLORIDE 3-0.9 GM/100ML-% IV SOLN
INTRAVENOUS | Status: AC
  Filled 2021-07-09: qty 100

## 2021-07-09 MED ORDER — KETOROLAC TROMETHAMINE 30 MG/ML IJ SOLN
INTRAMUSCULAR | Status: AC
  Filled 2021-07-09: qty 1

## 2021-07-09 MED ORDER — ONDANSETRON HCL 4 MG/2ML IJ SOLN
INTRAMUSCULAR | Status: DC | PRN
  Administered 2021-07-09: 4 mg via INTRAVENOUS

## 2021-07-09 MED ORDER — SUCCINYLCHOLINE CHLORIDE 200 MG/10ML IV SOSY
PREFILLED_SYRINGE | INTRAVENOUS | Status: AC
  Filled 2021-07-09: qty 10

## 2021-07-09 MED ORDER — BACITRACIN-NEOMYCIN-POLYMYXIN 400-5-5000 EX OINT
TOPICAL_OINTMENT | CUTANEOUS | Status: DC | PRN
  Administered 2021-07-09: 1 via TOPICAL

## 2021-07-09 MED ORDER — ROCURONIUM BROMIDE 10 MG/ML (PF) SYRINGE
PREFILLED_SYRINGE | INTRAVENOUS | Status: AC
  Filled 2021-07-09: qty 10

## 2021-07-09 MED ORDER — MIDAZOLAM HCL 2 MG/2ML IJ SOLN
INTRAMUSCULAR | Status: AC
Start: 2021-07-09 — End: ?
  Filled 2021-07-09: qty 2

## 2021-07-09 MED ORDER — PHENYLEPHRINE 80 MCG/ML (10ML) SYRINGE FOR IV PUSH (FOR BLOOD PRESSURE SUPPORT)
PREFILLED_SYRINGE | INTRAVENOUS | Status: AC
  Filled 2021-07-09: qty 10

## 2021-07-09 MED ORDER — BACITRACIN-NEOMYCIN-POLYMYXIN 400-5-5000 EX OINT
TOPICAL_OINTMENT | CUTANEOUS | Status: AC
  Filled 2021-07-09: qty 1

## 2021-07-09 MED ORDER — BUPIVACAINE HCL (PF) 0.5 % IJ SOLN
INTRAMUSCULAR | Status: DC | PRN
  Administered 2021-07-09: 20 mL

## 2021-07-09 MED ORDER — CEFAZOLIN SODIUM-DEXTROSE 1-4 GM/50ML-% IV SOLN
INTRAVENOUS | Status: AC
  Filled 2021-07-09: qty 50

## 2021-07-09 MED ORDER — 0.9 % SODIUM CHLORIDE (POUR BTL) OPTIME
TOPICAL | Status: DC | PRN
  Administered 2021-07-09: 1000 mL

## 2021-07-09 MED ORDER — CHLORHEXIDINE GLUCONATE CLOTH 2 % EX PADS: 6.0000 | MEDICATED_PAD | Freq: Once | CUTANEOUS | Status: DC

## 2021-07-09 MED ORDER — SUGAMMADEX SODIUM 200 MG/2ML IV SOLN
INTRAVENOUS | Status: DC | PRN
  Administered 2021-07-09: 350 mg via INTRAVENOUS

## 2021-07-09 MED ORDER — FENTANYL CITRATE (PF) 100 MCG/2ML IJ SOLN
INTRAMUSCULAR | Status: AC
  Filled 2021-07-09: qty 2

## 2021-07-09 MED ORDER — MIDAZOLAM HCL 2 MG/2ML IJ SOLN
INTRAMUSCULAR | Status: DC | PRN
  Administered 2021-07-09: 2 mg via INTRAVENOUS

## 2021-07-09 MED ORDER — HYDROMORPHONE HCL 1 MG/ML IJ SOLN: 0.2500 mg | INTRAMUSCULAR | Status: DC | PRN

## 2021-07-09 MED ORDER — ONDANSETRON HCL 4 MG/2ML IJ SOLN: 4.0000 mg | Freq: Once | INTRAMUSCULAR | Status: DC | PRN

## 2021-07-09 MED ORDER — DEXAMETHASONE SODIUM PHOSPHATE 4 MG/ML IJ SOLN
INTRAMUSCULAR | Status: DC | PRN
  Administered 2021-07-09: 4 mg via INTRAVENOUS

## 2021-07-09 MED ORDER — ORAL CARE MOUTH RINSE: 15.0000 mL | Freq: Once | OROMUCOSAL | Status: AC

## 2021-07-09 MED ORDER — OXYCODONE HCL 5 MG PO TABS: 5.0000 mg | ORAL_TABLET | ORAL | 0 refills | Status: DC | PRN

## 2021-07-09 MED ORDER — SUCCINYLCHOLINE CHLORIDE 200 MG/10ML IV SOSY
PREFILLED_SYRINGE | INTRAVENOUS | Status: DC | PRN
  Administered 2021-07-09: 120 mg via INTRAVENOUS

## 2021-07-09 MED ORDER — MEPERIDINE HCL 50 MG/ML IJ SOLN: 6.2500 mg | INTRAMUSCULAR | Status: DC | PRN

## 2021-07-09 MED ORDER — BUPIVACAINE HCL (PF) 0.5 % IJ SOLN
INTRAMUSCULAR | Status: AC
  Filled 2021-07-09: qty 30

## 2021-07-09 MED ORDER — PROPOFOL 10 MG/ML IV BOLUS
INTRAVENOUS | Status: DC | PRN
  Administered 2021-07-09: 200 mg via INTRAVENOUS
  Administered 2021-07-09: 50 mg via INTRAVENOUS

## 2021-07-09 MED ORDER — ROCURONIUM BROMIDE 10 MG/ML (PF) SYRINGE
PREFILLED_SYRINGE | INTRAVENOUS | Status: DC | PRN
  Administered 2021-07-09: 50 mg via INTRAVENOUS

## 2021-07-09 MED ORDER — LIDOCAINE HCL (PF) 2 % IJ SOLN
INTRAMUSCULAR | Status: AC
  Filled 2021-07-09: qty 5

## 2021-07-09 MED ORDER — GLYCOPYRROLATE PF 0.2 MG/ML IJ SOSY
PREFILLED_SYRINGE | INTRAMUSCULAR | Status: DC | PRN
  Administered 2021-07-09 (×2): .1 mg via INTRAVENOUS

## 2021-07-09 MED ORDER — DEXAMETHASONE SODIUM PHOSPHATE 10 MG/ML IJ SOLN
INTRAMUSCULAR | Status: AC
  Filled 2021-07-09: qty 1

## 2021-07-09 MED ORDER — CEFAZOLIN SODIUM-DEXTROSE 2-4 GM/100ML-% IV SOLN: 2.0000 g | INTRAVENOUS | Status: DC

## 2021-07-09 MED ORDER — EPHEDRINE SULFATE-NACL 50-0.9 MG/10ML-% IV SOSY
PREFILLED_SYRINGE | INTRAVENOUS | Status: DC | PRN
  Administered 2021-07-09: 5 mg via INTRAVENOUS

## 2021-07-09 MED ORDER — FENTANYL CITRATE (PF) 250 MCG/5ML IJ SOLN
INTRAMUSCULAR | Status: DC | PRN
  Administered 2021-07-09 (×2): 50 ug via INTRAVENOUS

## 2021-07-09 MED ORDER — LACTATED RINGERS IV SOLN
INTRAVENOUS | Status: DC
  Administered 2021-07-09: 1000 mL via INTRAVENOUS

## 2021-07-09 MED ORDER — PHENYLEPHRINE 80 MCG/ML (10ML) SYRINGE FOR IV PUSH (FOR BLOOD PRESSURE SUPPORT)
PREFILLED_SYRINGE | INTRAVENOUS | Status: DC | PRN
  Administered 2021-07-09: 80 ug via INTRAVENOUS

## 2021-07-09 MED ORDER — ONDANSETRON HCL 4 MG PO TABS: 4.0000 mg | ORAL_TABLET | Freq: Three times a day (TID) | ORAL | 1 refills | Status: AC | PRN

## 2021-07-09 MED ORDER — CEFAZOLIN IN SODIUM CHLORIDE 3-0.9 GM/100ML-% IV SOLN
3.0000 g | INTRAVENOUS | Status: AC
  Administered 2021-07-09: 3 g via INTRAVENOUS

## 2021-07-09 MED ORDER — ONDANSETRON HCL 4 MG/2ML IJ SOLN
INTRAMUSCULAR | Status: AC
  Filled 2021-07-09: qty 2

## 2021-07-09 MED ORDER — EPHEDRINE 5 MG/ML INJ
INTRAVENOUS | Status: AC
  Filled 2021-07-09: qty 5

## 2021-07-09 SURGICAL SUPPLY — 34 items
APL PRP STRL LF DISP 70% ISPRP (MISCELLANEOUS) ×2
CHLORAPREP W/TINT 26 (MISCELLANEOUS) ×3 IMPLANT
CLOTH BEACON ORANGE TIMEOUT ST (SAFETY) ×3 IMPLANT
COVER LIGHT HANDLE STERIS (MISCELLANEOUS) ×6 IMPLANT
DECANTER SPIKE VIAL GLASS SM (MISCELLANEOUS) ×3 IMPLANT
DRAPE HALF SHEET 40X57 (DRAPES) ×1 IMPLANT
DRAPE UTILITY W/TAPE 26X15 (DRAPES) ×2 IMPLANT
DRSG TELFA 3X8 NADH (GAUZE/BANDAGES/DRESSINGS) ×3 IMPLANT
ELECT REM PT RETURN 9FT ADLT (ELECTROSURGICAL) ×3
ELECTRODE REM PT RTRN 9FT ADLT (ELECTROSURGICAL) ×2 IMPLANT
GAUZE SPONGE 4X4 12PLY STRL (GAUZE/BANDAGES/DRESSINGS) ×1 IMPLANT
GLOVE BIO SURGEON STRL SZ 6.5 (GLOVE) ×3 IMPLANT
GLOVE BIOGEL PI IND STRL 6.5 (GLOVE) ×2 IMPLANT
GLOVE BIOGEL PI IND STRL 7.0 (GLOVE) ×4 IMPLANT
GLOVE BIOGEL PI INDICATOR 6.5 (GLOVE) ×1
GLOVE BIOGEL PI INDICATOR 7.0 (GLOVE) ×4
GLOVE SURG SS PI 6.5 STRL IVOR (GLOVE) ×1 IMPLANT
GLOVE SURG SS PI 7.0 STRL IVOR (GLOVE) ×1 IMPLANT
GLOVE SURG SS PI 7.5 STRL IVOR (GLOVE) ×1 IMPLANT
GOWN STRL REUS W/TWL LRG LVL3 (GOWN DISPOSABLE) ×7 IMPLANT
KIT TURNOVER KIT A (KITS) ×3 IMPLANT
MANIFOLD NEPTUNE II (INSTRUMENTS) ×3 IMPLANT
NDL HYPO 25X1 1.5 SAFETY (NEEDLE) ×2 IMPLANT
NEEDLE HYPO 25X1 1.5 SAFETY (NEEDLE) ×3 IMPLANT
NS IRRIG 1000ML POUR BTL (IV SOLUTION) ×3 IMPLANT
PACK MINOR (CUSTOM PROCEDURE TRAY) ×3 IMPLANT
PAD ARMBOARD 7.5X6 YLW CONV (MISCELLANEOUS) ×3 IMPLANT
PAD DRESSING TELFA 3X8 NADH (GAUZE/BANDAGES/DRESSINGS) IMPLANT
SET BASIN LINEN APH (SET/KITS/TRAYS/PACK) ×3 IMPLANT
SUT ETHILON 3 0 FSL (SUTURE) ×2 IMPLANT
SUT VIC AB 3-0 SH 27 (SUTURE) ×6
SUT VIC AB 3-0 SH 27X BRD (SUTURE) ×2 IMPLANT
SYR BULB IRRIG 60ML STRL (SYRINGE) ×3 IMPLANT
SYR CONTROL 10ML LL (SYRINGE) ×3 IMPLANT

## 2021-07-09 NOTE — Anesthesia Preprocedure Evaluation (Signed)
Anesthesia Evaluation  ?Patient identified by MRN, date of birth, ID band ?Patient awake ? ? ? ?Reviewed: ?Allergy & Precautions, NPO status , Patient's Chart, lab work & pertinent test results ? ?History of Anesthesia Complications ?(+) PONV and history of anesthetic complications ? ?Airway ?Mallampati: I ? ?TM Distance: >3 FB ?Neck ROM: Full ? ? ? Dental ? ?(+) Dental Advisory Given, Missing ?  ?Pulmonary ?shortness of breath and with exertion, asthma ,  ?  ?Pulmonary exam normal ?breath sounds clear to auscultation ? ? ? ? ? ? Cardiovascular ?Exercise Tolerance: Good ?hypertension, Pt. on medications ?Normal cardiovascular exam+ dysrhythmias (PVCs)  ?Rhythm:Regular Rate:Normal ? ? ?  ?Neuro/Psych ?PSYCHIATRIC DISORDERS Anxiety Depression   ? GI/Hepatic ?negative GI ROS, Neg liver ROS,   ?Endo/Other  ?Morbid obesity ? Renal/GU ?negative Renal ROS  ? ?  ?Musculoskeletal ? ?(+) Arthritis , Osteoarthritis,   ? Abdominal ?  ?Peds ? Hematology ?  ?Anesthesia Other Findings ? ? Reproductive/Obstetrics ? ?  ? ? ? ? ? ? ? ? ? ? ? ? ? ?  ?  ? ? ? ? ? ? ? ?Anesthesia Physical ?Anesthesia Plan ? ?ASA: 3 ? ?Anesthesia Plan: General  ? ?Post-op Pain Management: Dilaudid IV  ? ?Induction: Intravenous ? ?PONV Risk Score and Plan: 4 or greater and Ondansetron, Dexamethasone, Midazolam and Scopolamine patch - Pre-op ? ?Airway Management Planned: Oral ETT ? ?Additional Equipment:  ? ?Intra-op Plan:  ? ?Post-operative Plan: Extubation in OR ? ?Informed Consent: I have reviewed the patients History and Physical, chart, labs and discussed the procedure including the risks, benefits and alternatives for the proposed anesthesia with the patient or authorized representative who has indicated his/her understanding and acceptance.  ? ? ? ?Dental advisory given ? ?Plan Discussed with: CRNA and Surgeon ? ?Anesthesia Plan Comments:   ? ? ? ? ? ? ?Anesthesia Quick Evaluation ? ?

## 2021-07-09 NOTE — Anesthesia Postprocedure Evaluation (Signed)
Anesthesia Post Note ? ?Patient: Heather Delgado ? ?Procedure(s) Performed: EXCISION LIPOMA- UPPER ARM, LEFT (Left: Arm Upper) ?EXCISION LIPOMA- LOWER BACK, LEFT SIDE (Left: Back) ? ?Patient location during evaluation: Phase II ?Anesthesia Type: General ?Level of consciousness: awake and alert and oriented ?Pain management: pain level controlled ?Vital Signs Assessment: post-procedure vital signs reviewed and stable ?Respiratory status: spontaneous breathing, nonlabored ventilation and respiratory function stable ?Cardiovascular status: blood pressure returned to baseline and stable ?Postop Assessment: no apparent nausea or vomiting ?Anesthetic complications: no ? ? ?No notable events documented. ? ? ?Last Vitals:  ?Vitals:  ? 07/09/21 0958 07/09/21 1024  ?BP: 125/71 128/67  ?Pulse:  71  ?Resp:  16  ?Temp: 36.4 ?C 36.6 ?C  ?SpO2:  95%  ?  ?Last Pain:  ?Vitals:  ? 07/09/21 1024  ?TempSrc: Oral  ?PainSc: 2   ? ? ?  ?  ?  ?  ?  ?  ? ?Sedrick Tober C Daveigh Batty ? ? ? ? ?

## 2021-07-09 NOTE — Op Note (Signed)
[facility] ?Operative Note ? ?07/09/21 ? ?Preoperative Diagnosis: Lipomas left back and left arm  ?  ?Postoperative Diagnosis: Same ?  ?Procedure(s) Performed: Excision of lipomas of the left lower back 2cm and left upper arm 2cm  ?  ?Surgeon: Lanell Matar. Constance Haw, MD ?  ?Assistants: No qualified resident was available  ?  ?Anesthesia: General endotracheal ?  ?Anesthesiologist: Dr. Charna Elizabeth  ?  ?Specimens: Lipomas  ?  ?Estimated Blood Loss: Minimal ?  ?Blood Replacement: None  ?  ?Complications: None  ? ?Wound Class: Clean  ?  ?Operative Indications: Heather Delgado is a 61 yo with a history of multiple lipomas and concern she has dercum's disease. She wants to have a few lipomas excised to get a diagnosis. We discussed risk of bleeding, recurrence, no change in her pain, and infection.  ? ?Findings: Lipoma of the left back and left arm  ?  ?Procedure: The patient was taken to the operating room and placed supine. General endotracheal anesthesia was induced. Intravenous antibiotics were administered per protocol.  She was then placed in the right lateral position with all pressure points padded and straps in place. The left lower back and left upper arm were prepared and draped in the usual sterile fashion.  ? ?An incision was made over the lipoma on the left lower back and carried down to the subcutaneous tissue. The lipoma was easily extruded with pressure and was a solid piece of globular fat. The cavity was made hemostatic. Local was injected. The deep space was closed with 3-0 Vicryl interrupted and the skin was closed with 3-0 Nylon interrupted.  ? ?The left upper arm incision was made over the location that the patient indicated for this location. The incision was carried down into the subcutaneous tissue and smaller globules of fat were extruded with more finger like projections. The cavity was made hemostatic. Local was injected. The deep space was closed with 3-0 Vicryl interrupted and the skin  was closed with 3-0 Nylon interrupted.  ? ?Both incisions had neosporin and pressure dressings applied.  ? ?Final inspection revealed acceptable hemostasis. All counts were correct at the end of the case. The patient was awakened from anesthesia and extubated without complication.  The patient went to the PACU in stable condition. ?  ?Heather Labrum, MD ?[facility] ?[place], [place] [address] ?[phone] (office) ?  ?

## 2021-07-09 NOTE — Discharge Instructions (Addendum)
Discharge Instructions: ? ?Common Complaints: ?Pain at the incision site is common.  ?Some nausea is common and poor appetite is common after anesthesia. The main goal is to stay hydrated the first few days after surgery.  ? ?Diet/ Activity: ?Diet as tolerated. You may not have an appetite, but it is important to stay hydrated. Drink 64 ounces of water a day. Your appetite will return with time.  ?Remove the bandages in 48 hours (07/11/2021). It is ok to shower after the bandage is removed. You can do a bird bath until that time. After you remove the bandage, you can replace it and place neosporin on the incisions too. Try to place a pressure dressing on the area to help with recurrence and seroma formation per Dr. Gae Bon.  ?Shower per your regular routine daily.  ?Walk everyday for at least 15-20 minutes. Deep cough and move around every 1-2 hours in the first few days after surgery.  ?Limit excessive movement, lifting > 10 lbs, stretching with the limb if there is an incision on your arm/armpit or leg.   ?Limit stretching, pulling on your incision if it is located on other parts of your body.  ?Do not place lotions or balms on your incision unless instructed to specifically by Dr. Constance Haw.  ? ?Medication: ?Take tylenol and ibuprofen as needed for pain control, alternating every 4-6 hours.  ?Example:  ?Tylenol '1000mg'$  @ 6am, 12noon, 6pm, 40mdnight (Do not exceed '4000mg'$  of tylenol a day). Ibuprofen '800mg'$  @ 9am, 3pm, 9pm, 3am (Do not exceed '3600mg'$  of ibuprofen a day).  ?Take Roxicodone for breakthrough pain every 4 hours.  ?Take Colace for constipation related to narcotic pain medication. If you do not have a bowel movement in 2 days, take Miralax over the counter.  ?Drink plenty of water to also prevent constipation.  ? ?Contact Information: ?If you have questions or concerns, please call our office, [phone] Monday- Thursday 8AM-5PM and Friday 8AM-12Noon.  ?If it is after hours or on the weekend, please call  [facility] Main Number, [phone] and ask to speak to the surgeon on call [facility]  ? ?

## 2021-07-09 NOTE — Progress Notes (Signed)
[facility] ? ?Updated her daughter. Pressure dressing in place. Will send with some supplies to make sure she can do pressure dressings at home to help with recurrence / seroma. ? ?Rx to [facility]. ?Sutures out 5/30  ? ? ?Curlene Labrum, MD ?[facility] ?[place], [place] [address] ?[phone] (office) ? ?

## 2021-07-09 NOTE — Anesthesia Procedure Notes (Addendum)
Procedure Name: Intubation ?Date/Time: 07/09/2021 9:02 AM ?Performed by: Orlie Dakin, CRNA ?Pre-anesthesia Checklist: Patient identified, Emergency Drugs available, Suction available and Patient being monitored ?Patient Re-evaluated:Patient Re-evaluated prior to induction ?Oxygen Delivery Method: Circle system utilized ?Preoxygenation: Pre-oxygenation with 100% oxygen ?Induction Type: IV induction ?Laryngoscope Size: Sabra Heck and 3 ?Grade View: Grade I ?Tube type: Oral ?Tube size: 7.0 mm ?Number of attempts: 1 ?Airway Equipment and Method: Stylet ?Placement Confirmation: ETT inserted through vocal cords under direct vision, positive ETCO2 and breath sounds checked- equal and bilateral ?Secured at: 22 cm ?Tube secured with: Tape ?Dental Injury: Teeth and Oropharynx as per pre-operative assessment  ?Comments: Noted upper front teeth with "roughened edges".  Patient confirms. ? ? ? ? ?

## 2021-07-09 NOTE — Transfer of Care (Signed)
Immediate Anesthesia Transfer of Care Note ? ?Patient: Heather Delgado ? ?Procedure(s) Performed: EXCISION LIPOMA- UPPER ARM, LEFT (Left: Arm Upper) ?EXCISION LIPOMA- LOWER BACK, LEFT SIDE (Left: Back) ? ?Patient Location: PACU ? ?Anesthesia Type:General ? ?Level of Consciousness: awake and alert  ? ?Airway & Oxygen Therapy: Patient Spontanous Breathing and Patient connected to face mask oxygen ? ?Post-op Assessment: Report given to RN and Post -op Vital signs reviewed and stable ? ?Post vital signs: Reviewed and stable ? ?Last Vitals:  ?Vitals Value Taken Time  ?BP    ?Temp    ?Pulse 84 07/09/21 0959  ?Resp 12 07/09/21 0959  ?SpO2 100 % 07/09/21 0959  ?Vitals shown include unvalidated device data. ? ?Last Pain:  ?Vitals:  ? 07/09/21 0817  ?TempSrc: Oral  ?PainSc: 0-No pain  ?   ? ?Patients Stated Pain Goal: 6 (07/09/21 0817) ? ?Complications: No notable events documented. ?

## 2021-07-09 NOTE — Interval H&P Note (Signed)
History and Physical Interval Note: ? ?07/09/2021 ?8:32 AM ? ?Heather Delgado  has presented today for surgery, with the diagnosis of LEFT UPPER ARM LIPOMA, 1 CM ?LEFT LOWER BACK LIPOMA, 3 CM.  The various methods of treatment have been discussed with the patient and family. After consideration of risks, benefits and other options for treatment, the patient has consented to  Procedure(s): ?EXCISION LIPOMA- UPPER ARM, LEFT (Left) ?EXCISION LIPOMA- LOWER BACK, LEFT SIDE (Left) as a surgical intervention.  The patient's history has been reviewed, patient examined, no change in status, stable for surgery.  I have reviewed the patient's chart and labs.  Questions were answered to the patient's satisfaction.   ? ? ?Virl Cagey ? ? ?

## 2021-07-10 ENCOUNTER — Telehealth: Payer: Self-pay | Admitting: *Deleted

## 2021-07-10 LAB — SURGICAL PATHOLOGY

## 2021-07-10 NOTE — Telephone Encounter (Signed)
Received call from patient [phone]~ telephone.   Surgical Date: 07/09/2021 Procedure: Excision Lipoma- upper left arm, lower back  Patient noted irritation to face across nose and upper cheeks in butterfly pattern. Advised that irritation is most like due to adhesive or tubing on face. Advised that areas should lessen with time. Advised if areas are itchy, she can use OTC cortisone cream or benadryl cream.   Also states that she has noted bright red blood in sputum with cough after surgery. Also advised that this is most likely due to intubation. Advised that irritation should improve and blood tinged sputum should lessen. Advised if she continues to see copious amounts of blood to return to ER for evaluation. Patient reports that cough has improved and amount of sputum has lessened.   Dr. Constance Haw to be made aware.

## 2021-07-14 ENCOUNTER — Encounter (HOSPITAL_COMMUNITY): Payer: Self-pay | Admitting: General Surgery

## 2021-07-22 ENCOUNTER — Encounter: Payer: BC Managed Care – PPO | Admitting: General Surgery

## 2021-07-23 DIAGNOSIS — I10 Essential (primary) hypertension: Secondary | ICD-10-CM | POA: Diagnosis not present

## 2021-07-23 DIAGNOSIS — Z6841 Body Mass Index (BMI) 40.0 and over, adult: Secondary | ICD-10-CM | POA: Diagnosis not present

## 2021-07-23 DIAGNOSIS — M545 Low back pain, unspecified: Secondary | ICD-10-CM | POA: Diagnosis not present

## 2021-07-24 ENCOUNTER — Encounter: Payer: Self-pay | Admitting: General Surgery

## 2021-07-24 ENCOUNTER — Ambulatory Visit (INDEPENDENT_AMBULATORY_CARE_PROVIDER_SITE_OTHER): Payer: BC Managed Care – PPO | Admitting: General Surgery

## 2021-07-24 VITALS — BP 126/78 | HR 78 | Temp 97.5°F | Resp 16 | Ht 64.0 in | Wt 337.0 lb

## 2021-07-24 DIAGNOSIS — E882 Lipomatosis, not elsewhere classified: Secondary | ICD-10-CM

## 2021-07-24 NOTE — Progress Notes (Signed)
[name] Note   HPI:  61 y.o. Female presents to clinic for post-op follow-up evaluation after her lipoma removal from her upper left arm and left back. Patient reports she is doing well and feels well. The pain from the lipomas was relieved after getting them out. She only took 1 pain pill. She is doing well and the pathology came back with lipomas.   Review of Systems:  No erythema and no drainage No fevers No pain All other review of systems: otherwise negative   Vital Signs:  BP 126/78   Pulse 78   Temp (!) 97.5 F (36.4 C) (Oral)   Resp 16   Ht '5\' 4"'$  (1.626 m)   Wt (!) 337 lb (152.9 kg)   SpO2 95%   BMI 57.85 kg/m    Physical Exam:  Physical Exam Vitals reviewed.  Constitutional:      Appearance: She is obese.  Cardiovascular:     Rate and Rhythm: Normal rate.  Pulmonary:     Effort: Pulmonary effort is normal.  Musculoskeletal:     Comments: Left upper arm incision c/d/I with no erythema or drainage, and healed incision, steri strips placed after sutures removed; left lower back incision c/d/I with no erythema or drainage, sutures removed, and steri strips placed   Neurological:     General: No focal deficit present.    Pathology: FINAL MICROSCOPIC DIAGNOSIS:   A. BACK, LOWER, LEFT, LIPOMA, EXCISION:  - Mature adipose tissue, consistent with lipoma   B. ARM, LOWER, LEFT, LIPOMA, EXCISION:  - Mature adipose tissue, consistent with lipoma   Assessment:  61 y.o. yo Female with multiple lipomas over her torso, upper arms, upper thighs and these are very painful and in the case of the ones that we removed they were extremely painful.  Her pathology was consistent with lipomas.   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   She knows that removal can  help with pain sometimes but also that recurrences can happen and that excise does not always help.   Plan:  Steristrips will peel off in the next 5-7 days. You can remove them once they are peeling off. It is ok to shower. Pat the area dry.   Follow up as needed.  Call if you have questions or concerns or other areas that are causing extreme pain.   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 Heather Del[place], [place] [address] [phone] (office)

## 2021-07-24 NOTE — Patient Instructions (Signed)
Steristrips will peel off in the next 5-7 days. You can remove them once they are peeling off. It is ok to shower. Pat the area dry.  Call with issues. Call if you want to try to remove others that are getting painful in future.

## 2021-07-30 ENCOUNTER — Encounter: Payer: BC Managed Care – PPO | Admitting: General Surgery

## 2021-08-12 DIAGNOSIS — M170 Bilateral primary osteoarthritis of knee: Secondary | ICD-10-CM | POA: Diagnosis not present

## 2021-08-12 DIAGNOSIS — E882 Lipomatosis, not elsewhere classified: Secondary | ICD-10-CM | POA: Diagnosis not present

## 2021-08-12 DIAGNOSIS — M7989 Other specified soft tissue disorders: Secondary | ICD-10-CM | POA: Diagnosis not present

## 2021-08-28 ENCOUNTER — Telehealth: Payer: Self-pay | Admitting: *Deleted

## 2021-08-28 NOTE — Telephone Encounter (Signed)
Received fax from [place], [place], [place]~ 1- [place] telephone/ [phone]~ fax.   Electronically signed HIPPA Compliant Authorization for Release of Medical Information enclosed with date of 08/25/2021.  Medical records requested: chart notes covering 06/09/2021- present.    Medical Record Request ID: [id number]  Records from [facility] faxed to [place] and confirmation received.

## 2021-09-02 DIAGNOSIS — R350 Frequency of micturition: Secondary | ICD-10-CM | POA: Diagnosis not present

## 2021-09-02 DIAGNOSIS — Z752 Other waiting period for investigation and treatment: Secondary | ICD-10-CM | POA: Diagnosis not present

## 2021-09-02 DIAGNOSIS — N390 Urinary tract infection, site not specified: Secondary | ICD-10-CM | POA: Diagnosis not present

## 2021-09-25 DIAGNOSIS — M2550 Pain in unspecified joint: Secondary | ICD-10-CM | POA: Diagnosis not present

## 2021-09-25 DIAGNOSIS — M797 Fibromyalgia: Secondary | ICD-10-CM | POA: Diagnosis not present

## 2021-09-25 DIAGNOSIS — M7711 Lateral epicondylitis, right elbow: Secondary | ICD-10-CM | POA: Diagnosis not present

## 2021-09-25 DIAGNOSIS — M179 Osteoarthritis of knee, unspecified: Secondary | ICD-10-CM | POA: Diagnosis not present

## 2021-09-29 DIAGNOSIS — Z136 Encounter for screening for cardiovascular disorders: Secondary | ICD-10-CM | POA: Diagnosis not present

## 2021-09-29 DIAGNOSIS — Z0000 Encounter for general adult medical examination without abnormal findings: Secondary | ICD-10-CM | POA: Diagnosis not present

## 2021-10-06 DIAGNOSIS — Z0001 Encounter for general adult medical examination with abnormal findings: Secondary | ICD-10-CM | POA: Diagnosis not present

## 2021-10-31 ENCOUNTER — Ambulatory Visit: Payer: BC Managed Care – PPO

## 2021-11-14 ENCOUNTER — Ambulatory Visit
Admission: RE | Admit: 2021-11-14 | Discharge: 2021-11-14 | Disposition: A | Discharge status: Home / Self Care | Payer: BC Managed Care – PPO | Source: Ambulatory Visit | Attending: Family Medicine | Admitting: Family Medicine

## 2021-11-14 DIAGNOSIS — Z1231 Encounter for screening mammogram for malignant neoplasm of breast: Secondary | ICD-10-CM | POA: Diagnosis not present

## 2021-11-17 ENCOUNTER — Other Ambulatory Visit: Payer: Self-pay | Admitting: Family Medicine

## 2021-11-17 ENCOUNTER — Telehealth: Payer: Self-pay | Admitting: *Deleted

## 2021-11-17 DIAGNOSIS — R928 Other abnormal and inconclusive findings on diagnostic imaging of breast: Secondary | ICD-10-CM

## 2021-11-17 NOTE — Telephone Encounter (Signed)
Received mail from [facility]~ [phone] telephone/ [phone]~ fax.   Electronically signed HIPPA Compliant Authorization for Release of Medical Information enclosed with date of 10/28/2021.  Medical records requested: all records 02/23/2021- present   Medical Record Request ID: [id number]  Records from [facility] uploaded to portal: [email or web address] PR ID: [id number] Access Code: [email or web address]

## 2021-11-27 ENCOUNTER — Ambulatory Visit
Admission: RE | Admit: 2021-11-27 | Discharge: 2021-11-27 | Disposition: A | Discharge status: Home / Self Care | Payer: BC Managed Care – PPO | Source: Ambulatory Visit | Attending: Family Medicine | Admitting: Family Medicine

## 2021-11-27 ENCOUNTER — Other Ambulatory Visit: Payer: Self-pay | Admitting: Family Medicine

## 2021-11-27 DIAGNOSIS — R928 Other abnormal and inconclusive findings on diagnostic imaging of breast: Secondary | ICD-10-CM | POA: Diagnosis not present

## 2021-11-27 DIAGNOSIS — N6312 Unspecified lump in the right breast, upper inner quadrant: Secondary | ICD-10-CM | POA: Diagnosis not present

## 2021-11-27 DIAGNOSIS — N6311 Unspecified lump in the right breast, upper outer quadrant: Secondary | ICD-10-CM | POA: Diagnosis not present

## 2021-11-27 DIAGNOSIS — N6310 Unspecified lump in the right breast, unspecified quadrant: Secondary | ICD-10-CM

## 2021-12-03 DIAGNOSIS — T8484XA Pain due to internal orthopedic prosthetic devices, implants and grafts, initial encounter: Secondary | ICD-10-CM | POA: Diagnosis not present

## 2021-12-03 DIAGNOSIS — M170 Bilateral primary osteoarthritis of knee: Secondary | ICD-10-CM | POA: Diagnosis not present

## 2021-12-03 DIAGNOSIS — Z96652 Presence of left artificial knee joint: Secondary | ICD-10-CM | POA: Diagnosis not present

## 2022-06-01 ENCOUNTER — Ambulatory Visit
Admission: RE | Admit: 2022-06-01 | Discharge: 2022-06-01 | Disposition: A | Discharge status: Home / Self Care | Payer: 59 | Source: Ambulatory Visit | Attending: Family Medicine | Admitting: Family Medicine

## 2022-06-01 ENCOUNTER — Other Ambulatory Visit: Payer: Self-pay | Admitting: Family Medicine

## 2022-06-01 DIAGNOSIS — N6310 Unspecified lump in the right breast, unspecified quadrant: Secondary | ICD-10-CM

## 2022-07-13 ENCOUNTER — Emergency Department (HOSPITAL_COMMUNITY)
Admission: EM | Admit: 2022-07-13 | Discharge: 2022-07-13 | Discharge status: Against Medical Advice | Payer: 59 | Source: Home / Self Care

## 2022-07-16 ENCOUNTER — Encounter (HOSPITAL_COMMUNITY): Payer: Self-pay | Admitting: Family Medicine

## 2022-07-16 ENCOUNTER — Ambulatory Visit (HOSPITAL_COMMUNITY)
Admission: RE | Admit: 2022-07-16 | Discharge: 2022-07-16 | Disposition: A | Discharge status: Home / Self Care | Payer: 59 | Source: Ambulatory Visit | Attending: Family Medicine | Admitting: Family Medicine

## 2022-07-16 ENCOUNTER — Other Ambulatory Visit (HOSPITAL_COMMUNITY): Payer: Self-pay | Admitting: Family Medicine

## 2022-07-16 DIAGNOSIS — R058 Other specified cough: Secondary | ICD-10-CM

## 2022-11-09 ENCOUNTER — Ambulatory Visit: Payer: 59 | Admitting: Obstetrics & Gynecology

## 2022-12-01 ENCOUNTER — Other Ambulatory Visit: Payer: 59

## 2022-12-08 ENCOUNTER — Ambulatory Visit: Payer: 59 | Admitting: Obstetrics & Gynecology

## 2022-12-14 ENCOUNTER — Encounter: Payer: Self-pay | Admitting: Family Medicine

## 2023-01-08 ENCOUNTER — Other Ambulatory Visit: Payer: Self-pay | Admitting: Family Medicine

## 2023-01-08 ENCOUNTER — Ambulatory Visit
Admission: RE | Admit: 2023-01-08 | Discharge: 2023-01-08 | Disposition: A | Discharge status: Home / Self Care | Payer: 59 | Source: Ambulatory Visit | Attending: Family Medicine | Admitting: Family Medicine

## 2023-01-08 DIAGNOSIS — N6310 Unspecified lump in the right breast, unspecified quadrant: Secondary | ICD-10-CM

## 2023-04-09 DIAGNOSIS — R7303 Prediabetes: Secondary | ICD-10-CM | POA: Diagnosis not present

## 2023-04-09 DIAGNOSIS — I10 Essential (primary) hypertension: Secondary | ICD-10-CM | POA: Diagnosis not present

## 2023-04-15 DIAGNOSIS — I10 Essential (primary) hypertension: Secondary | ICD-10-CM | POA: Diagnosis not present

## 2023-04-15 DIAGNOSIS — R7303 Prediabetes: Secondary | ICD-10-CM | POA: Diagnosis not present

## 2023-04-15 DIAGNOSIS — E782 Mixed hyperlipidemia: Secondary | ICD-10-CM | POA: Diagnosis not present

## 2023-05-05 DIAGNOSIS — J4541 Moderate persistent asthma with (acute) exacerbation: Secondary | ICD-10-CM | POA: Diagnosis not present

## 2023-08-17 DIAGNOSIS — F411 Generalized anxiety disorder: Secondary | ICD-10-CM | POA: Diagnosis not present

## 2023-08-24 ENCOUNTER — Telehealth: Payer: Self-pay | Admitting: Nutrition

## 2023-08-24 NOTE — Telephone Encounter (Signed)
 Vm left to call and schedule appt from referral .

## 2023-10-05 DIAGNOSIS — R7303 Prediabetes: Secondary | ICD-10-CM | POA: Diagnosis not present

## 2023-10-05 DIAGNOSIS — L819 Disorder of pigmentation, unspecified: Secondary | ICD-10-CM | POA: Diagnosis not present

## 2023-10-05 DIAGNOSIS — R5382 Chronic fatigue, unspecified: Secondary | ICD-10-CM | POA: Diagnosis not present

## 2023-10-11 ENCOUNTER — Other Ambulatory Visit (HOSPITAL_COMMUNITY): Payer: Self-pay | Admitting: Family Medicine

## 2023-10-11 DIAGNOSIS — L819 Disorder of pigmentation, unspecified: Secondary | ICD-10-CM

## 2023-10-15 ENCOUNTER — Ambulatory Visit (HOSPITAL_COMMUNITY)
Admission: RE | Admit: 2023-10-15 | Discharge: 2023-10-15 | Disposition: A | Discharge status: Home / Self Care | Source: Ambulatory Visit | Attending: Family Medicine | Admitting: Family Medicine

## 2023-10-15 DIAGNOSIS — L819 Disorder of pigmentation, unspecified: Secondary | ICD-10-CM | POA: Diagnosis not present

## 2023-11-15 DIAGNOSIS — K529 Noninfective gastroenteritis and colitis, unspecified: Secondary | ICD-10-CM | POA: Diagnosis not present

## 2023-11-16 DIAGNOSIS — K529 Noninfective gastroenteritis and colitis, unspecified: Secondary | ICD-10-CM | POA: Diagnosis not present

## 2023-12-01 ENCOUNTER — Other Ambulatory Visit (HOSPITAL_COMMUNITY): Payer: Self-pay | Admitting: Family Medicine

## 2023-12-01 DIAGNOSIS — K5289 Other specified noninfective gastroenteritis and colitis: Secondary | ICD-10-CM

## 2023-12-01 DIAGNOSIS — K529 Noninfective gastroenteritis and colitis, unspecified: Secondary | ICD-10-CM

## 2023-12-02 ENCOUNTER — Encounter (HOSPITAL_BASED_OUTPATIENT_CLINIC_OR_DEPARTMENT_OTHER): Payer: Self-pay

## 2023-12-06 ENCOUNTER — Ambulatory Visit (HOSPITAL_BASED_OUTPATIENT_CLINIC_OR_DEPARTMENT_OTHER)
Admission: RE | Admit: 2023-12-06 | Discharge: 2023-12-06 | Disposition: A | Discharge status: Home / Self Care | Payer: Self-pay | Source: Ambulatory Visit | Attending: Family Medicine | Admitting: Family Medicine

## 2023-12-06 DIAGNOSIS — K529 Noninfective gastroenteritis and colitis, unspecified: Secondary | ICD-10-CM | POA: Diagnosis not present

## 2023-12-06 DIAGNOSIS — K5730 Diverticulosis of large intestine without perforation or abscess without bleeding: Secondary | ICD-10-CM | POA: Diagnosis not present

## 2023-12-06 DIAGNOSIS — Z9049 Acquired absence of other specified parts of digestive tract: Secondary | ICD-10-CM | POA: Diagnosis not present

## 2023-12-06 DIAGNOSIS — K5289 Other specified noninfective gastroenteritis and colitis: Secondary | ICD-10-CM | POA: Diagnosis not present

## 2023-12-06 MED ORDER — IOHEXOL 300 MG/ML  SOLN
100.0000 mL | Freq: Once | INTRAMUSCULAR | Status: AC | PRN
  Administered 2023-12-06: 100 mL via INTRAVENOUS

## 2023-12-12 NOTE — H&P (View-Only) (Signed)
 GI Office Note    Referring Provider: Hyacinth Honey, NP Primary Care Physician:  Hyacinth Honey, NP  Primary Gastroenterologist: Deatrice Casilda Dine, MD (previously Dr. Golda)  Chief Complaint   Chief Complaint  Patient presents with   New Patient (Initial Visit)    Pt here for abnormal findings on CT and she stated blood in stool, diarrhea. Combination of things   History of Present Illness   Heather Delgado is a 63 y.o. female presenting today at the request of Hyacinth Honey, NP for abnormal imaging of the colon and changes in bowel habits.  Colonoscopy 2008:  1. Few scattered diverticula at sigmoid and descending colon.  2. No evidence of colitis or ileitis.  3. Evidence of colitis or terminal ileitis.  4. Random biopsy taken from sigmoid colon looking for microscopic/collagenous colitis. Biopsies negative.   Seen in May 2020  by Gattis Buba for rectal bleeding, diarrhea. Scheduled for colonoscopy. Symptoms resolved with stopping diclofenac.   Colonoscopy July 2020: - Three small polyps in the sigmoid colon, at the splenic flexure and in the cecum. Biopsied.  - Diverticulosis in the sigmoid colon.  - One 4 mm polyp in the rectum - external hemorrhoids - Path: sessile serrated and hyperplastic polyp - Unclear when repeat recommended.   Labs 11/16/23: H pylori stool negative. Fecal elastase 168, Cdiff negative, stool culture negative.   Seen PCP 11/30/2023 for frequent diarrhea. Taking lomotil and restora probiotic. Reportedly taking OTC pancreatic enzymes? Diarhrea for 8 weeks, reportedly started after last diverticulitis flare. Taking cipro , failed Augmentin?, CT ordered.  CT A/P with contrast 12/06/2023: - Nonobstructive renal stones measure up to 9 mm  - Questionable cecal polyp/mass measures 3.4 x 1.4 cm  - Colonic diverticulosis.   Today:  Discussed the use of AI scribe software for clinical note transcription with the patient, who gave verbal consent to  proceed.  Her symptoms began in the first week of August following exposure to a stomach virus from her brother. Initially thought to be a stomach virus, the symptoms persisted. She was treated with Augmentin for a suspected diverticulitis flare-up, which alleviated the pain in her side but not the diarrhea.  She has a history of irritable bowel syndrome (IBS) diagnosed at age 52, with symptoms predominantly of diarrhea over constipation. She has tried various dietary modifications, including a gluten-free diet, liquid diet, and the BRAT diet, without significant improvement. A fecal test indicated moderate pancreatic insufficiency, and she began taking over-the-counter digestive enzymes, initially once a day and then with every meal. She reports that something has helped with her symptoms, but she still experiences diarrhea. When symptoms first started after illness exposure she was going an innumerable amount of times - possible 20+.   She experiences diarrhea approximately 5-7 times a day, with larger amounts in the morning and smaller amounts throughout the day. The consistency varies between loose, mushy, and watery. She experiences occasional nausea, particularly after dinner, but no vomiting or trouble swallowing. She has had some episodes of indigestion at night, will take Pepto to help with this. No dysphagia or vomiting.   She has experienced episodes of rectal bleeding, with the last occurrence a week ago. The bleeding is described as dark and around the stool, not associated with her known small external hemorrhoid. She has a history of polyps and is concerned about a potential new polyp seen on imaging.  She has a history of diverticulitis, with pain typically on the left side, and reports current  pain across the lower abdomen. She has experienced significant weight loss, attributed to dietary changes, including eliminating junk food and reducing sugar intake.  Her family history includes  her brother having blood clots and pulmonary embolism post-COVID, which influences her decision to take a baby aspirin  daily as a precaution.       Wt Readings from Last 5 Encounters:  12/13/23 (!) 317 lb 12.8 oz (144.2 kg)  07/24/21 (!) 337 lb (152.9 kg)  07/07/21 (!) 337 lb (152.9 kg)  07/01/21 (!) 337 lb (152.9 kg)  08/25/18 (!) 337 lb (152.9 kg)   Body mass index is 54.55 kg/m.   Current Outpatient Medications  Medication Sig Dispense Refill   acetaminophen  (TYLENOL ) 500 MG tablet Take 1,000 mg by mouth every 6 (six) hours as needed for moderate pain or headache.     albuterol  (PROVENTIL  HFA;VENTOLIN  HFA) 108 (90 BASE) MCG/ACT inhaler Inhale 2 puffs into the lungs daily.     aspirin  EC 81 MG tablet Take 81 mg by mouth daily. Swallow whole.     atenolol  (TENORMIN ) 25 MG tablet Take 25-50 mg by mouth See admin instructions. Take 25 mg every night, on days when experiencing palpitations take 50 mg instead     cetirizine (ZYRTEC) 10 MG tablet Take 10 mg by mouth daily. Alternate with Xyzal     Cholecalciferol  (VITAMIN D) 125 MCG CAPS Take by mouth.     clobetasol ointment (TEMOVATE) 0.05 % Apply 1 application topically daily as needed (lichen sclerosus).      D-Mannose 500 MG CAPS Take 1,000 mg by mouth daily.     diclofenac (VOLTAREN) 75 MG EC tablet Take 75-150 mg by mouth 2 (two) times daily.     DULoxetine  (CYMBALTA ) 60 MG capsule Take 60 mg by mouth at bedtime.      EPINEPHrine 0.3 mg/0.3 mL IJ SOAJ injection Inject 0.3 mg into the muscle daily as needed for anaphylaxis.      fluticasone (FLONASE) 50 MCG/ACT nasal spray Place 1 spray into both nostrils daily as needed for allergies or rhinitis.     lidocaine  4 % Place 1 patch onto the skin daily as needed (pain).     lisinopril  (ZESTRIL ) 40 MG tablet Take 40 mg by mouth daily.     loperamide (IMODIUM) 2 MG capsule Take 2 mg by mouth as needed for diarrhea or loose stools.     montelukast (SINGULAIR) 10 MG tablet Take 10 mg by  mouth at bedtime.      Omega 3 1000 MG CAPS Take by mouth.     Polyethyl Glycol-Propyl Glycol (SYSTANE) 0.4-0.3 % SOLN Place 1 drop into both eyes daily as needed (Dry eyes).     Probiotic Product (PROBIOTIC PO) Take 1 capsule by mouth daily.     traMADol (ULTRAM) 50 MG tablet Take 50 mg by mouth every 8 (eight) hours as needed for moderate pain.     trolamine salicylate (ASPERCREME) 10 % cream Apply 1 application. topically as needed for muscle pain.     Trolamine Salicylate (BLUE-EMU HEMP EX) Apply 1 application. topically daily as needed (pain).     TURMERIC CURCUMIN PO Take 2,250 mg by mouth at bedtime.     Vibegron (GEMTESA) 75 MG TABS Take 75 mg by mouth daily.     zinc gluconate 50 MG tablet Take 50 mg by mouth daily.     Docusate Calcium (STOOL SOFTENER PO) Take 1 tablet by mouth daily as needed (constipation). (Patient not taking: Reported on  12/13/2023)     furosemide (LASIX) 20 MG tablet Take 20 mg by mouth daily as needed for edema.     levocetirizine (XYZAL) 5 MG tablet Take 5 mg by mouth every evening. Alternate with Cetirizine (Patient not taking: Reported on 12/13/2023)     LORazepam (ATIVAN) 0.5 MG tablet Take 0.5 mg by mouth every 4 (four) hours as needed for anxiety. (Patient not taking: Reported on 12/13/2023)     Multiple Vitamins-Minerals (MULTIVITAMIN WITH MINERALS) tablet Take 1 tablet by mouth daily. Detox  Vit C & d3 Zinc  EgCg Nac     OVER THE COUNTER MEDICATION Take 750 mg by mouth daily. green lipped mussel oil     No current facility-administered medications for this visit.    Past Medical History:  Diagnosis Date   Anxiety    Arthritis    Asthma    Back pain    Complication of anesthesia    desats/ drop in BP after anesthesia   Depression    Diverticulitis    DJD (degenerative joint disease)    Dysrhythmia    Hypertension    IBS (irritable bowel syndrome)    PONV (postoperative nausea and vomiting)    PVC's (premature ventricular contractions)     Reflux    Spondylolysis     Past Surgical History:  Procedure Laterality Date   BIOPSY  08/25/2018   Procedure: BIOPSY;  Surgeon: Golda Claudis PENNER, MD;  Location: [facility];  Service: Endoscopy;;   CARPAL TUNNEL RELEASE     CESAREAN SECTION     CHOLECYSTECTOMY     COLONOSCOPY N/A 08/25/2018   Procedure: COLONOSCOPY;  Surgeon: Golda Claudis PENNER, MD;  Location: [facility];  Service: Endoscopy;  Laterality: N/A;  1030   FINGER SURGERY     I & D EXTREMITY  12/30/2011   Procedure: IRRIGATION AND DEBRIDEMENT EXTREMITY;  Surgeon: Elsie Mussel, MD;  Location: [facility];  Service: Orthopedics;  Laterality: Right;  1st finger   INFECTED SKIN DEBRIDEMENT  12/29/2011   RIGHT INDEX    IR RADIOLOGIST EVAL & MGMT  03/04/2017   IR VERTEBROPLASTY LUMBAR BX INC UNI/BIL INC/INJECT/IMAGING  03/12/2017   KNEE SURGERY     LIPOMA EXCISION Left 07/09/2021   Procedure: EXCISION LIPOMA- UPPER ARM, LEFT;  Surgeon: Kallie Manuelita BROCKS, MD;  Location: [facility];  Service: General;  Laterality: Left;   MASS EXCISION Left 07/09/2021   Procedure: EXCISION LIPOMA- LOWER BACK, LEFT SIDE;  Surgeon: Kallie Manuelita BROCKS, MD;  Location: [facility];  Service: General;  Laterality: Left;   POLYPECTOMY  08/25/2018   Procedure: POLYPECTOMY;  Surgeon: Golda Claudis PENNER, MD;  Location: [facility];  Service: Endoscopy;;    Family History  Problem Relation Age of Onset   Stroke Maternal Grandmother    Heart disease Maternal Grandfather    Cancer Maternal Grandfather    Alzheimer's disease Maternal Grandfather    Heart disease Paternal Grandmother    Heart disease Paternal Grandfather    Breast cancer Neg Hx     Allergies as of 12/13/2023 - Review Complete 12/13/2023  Allergen Reaction Noted   Duramorph  [morphine ] Rash 10/03/2014   Triamterene Anaphylaxis and Rash 09/07/2011   Gabapentin Other (See Comments) 05/27/2017   Lyrica [pregabalin]  08/19/2018   Thimerosal (thiomersal)  08/19/2018   Latex Rash 02/15/2013    Social  History   Socioeconomic History   Marital status: Married    Spouse name: Not on file   Number of children:  Not on file   Years of education: Not on file   Highest education level: Not on file  Occupational History   Not on file  Tobacco Use   Smoking status: Never   Smokeless tobacco: Never  Vaping Use   Vaping status: Never Used  Substance and Sexual Activity   Alcohol use: No    Comment: occ   Drug use: No   Sexual activity: Yes  Other Topics Concern   Not on file  Social History Narrative   Not on file   Social Drivers of Health   Financial Resource Strain: Not on file  Food Insecurity: Not on file  Transportation Needs: Not on file  Physical Activity: Not on file  Stress: Not on file  Social Connections: Not on file  Intimate Partner Violence: Not on file     Review of Systems   Gen: Denies any fever, chills, fatigue, weight loss, lack of appetite.  CV: Denies chest pain, heart palpitations, peripheral edema, syncope.  Resp: Denies shortness of breath at rest or with exertion. Denies wheezing or cough.  GI: see HPI GU : Denies urinary burning, urinary frequency, urinary hesitancy MS: Denies joint pain, muscle weakness, cramps, or limitation of movement.  Derm: Denies rash, itching, dry skin Psych: Denies depression, anxiety, memory loss, and confusion Heme: Denies bruising, bleeding, and enlarged lymph nodes.  Physical Exam   BP 109/67   Pulse 70   Temp 98.6 F (37 C)   Ht 5' 4 (1.626 m)   Wt (!) 317 lb 12.8 oz (144.2 kg)   BMI 54.55 kg/m   General:   Alert and oriented. Pleasant and cooperative. Well-nourished and well-developed.  Head:  Normocephalic and atraumatic. Eyes:  Without icterus, sclera clear and conjunctiva pink.  Ears:  Normal auditory acuity. Mouth:  No deformity or lesions, oral mucosa pink.  Lungs:  Clear to auscultation bilaterally. No wheezes, rales, or rhonchi. No distress.  Heart:  S1, S2 present without murmurs appreciated.   Abdomen:  +BS, soft,  non-distended. Mild ttp to left abdomen (mid) not quite left flank. No HSM noted. No guarding or rebound. No masses appreciated.  Rectal:  recommended and offered - pt declined. Msk:  Symmetrical without gross deformities. Normal posture. Extremities:  Without edema. Neurologic:  Alert and  oriented x4;  grossly normal neurologically. Skin:  Intact without significant lesions or rashes. Psych:  Alert and cooperative. Normal mood and affect.  Assessment & Plan   Heather Delgado is a 63 y.o. female with a history of HTN, prediabetes, IBS, depression, HLD, obesity, lipomatosis, restless less syndrome, polycythemia vera, fibromyalgia, MASLD, osteoarthritis, and overactive bladder presenting today with blood in stools, diarrhea likely 2/2 EPI, and abnormal imaging of the colon.     Irritable bowel syndrome with diarrhea and post-infectious symptoms Chronic irritable bowel syndrome with diarrhea, exacerbated by recent viral infection and antibiotic use for diverticulitis. Symptoms include 5-7 episodes of diarrhea per day, urgency, cramping, occasional nausea, and mild reflux. Post-infectious IBS is suspected due to recent viral exposure. - Tried BRAT diet, symptoms also likely secondary to EPI as below  Pancreatic exocrine insufficiency Moderate pancreatic insufficiency indicated by fecal test, possibly exacerbated by diarrhea. Over-the-counter digestive enzymes have provided some symptom improvement. CT scan shows no structural abnormalities, suggesting functional insufficiency contributing to diarrhea and malabsorption. - Provide samples of Zenpep 40K tablets to assess effectiveness in improving stool bulk and frequency. - Instruct to take Zenpep 2 tablets with meals and 1 with substantial  snacks. - Monitor for constipation and report if it occurs.  Diverticulosis of colon with recurrent left lower quadrant pain Diverticulosis with recurrent left lower quadrant pain. Recent  CT scan showed diverticulosis without signs of diverticulitis. Pain may be related to IBS or gas. Previous antibiotic treatment resolved initial pain but not diarrhea. - Schedule colonoscopy to evaluate diverticulosis and assess for any progression or complications.  Rectal bleeding, evaluation for possible colonic polyp or diverticular source Intermittent rectal bleeding with darker blood around stool, suggesting a colonic source. Possible sources include colonic polyps or diverticular disease. Also possibly from hemorrhoid. Rectal exam offered but patient declined.  - Schedule colonoscopy to evaluate source of bleeding and assess for polyps or diverticular disease.  History of colonic polyps, recent abnormal CT of the colon with possible cecal mass/polyp History of colonic polyps with recent CT scan indicating a possible cecal mass or polyp. Previous colonoscopy in July 2020 showed polyps. Further evaluation is needed to rule out malignancy or other pathology. - Schedule colonoscopy to evaluate possible cecal mass/polyp and assess for new or recurrent polyps.  Obesity, improved with weight loss Significant weight loss achieved through dietary changes, improving overall health and possibly liver function, as previous fatty liver findings were absent on recent imaging. - Continue current dietary modifications to maintain weight loss and improve overall health.      Follow up   Follow up post procedure.    Charmaine Melia, MSN, FNP-BC, AGACNP-BC [facility]

## 2023-12-12 NOTE — Progress Notes (Unsigned)
 GI Office Note    Referring Provider: Hyacinth Honey, NP Primary Care Physician:  Hyacinth Honey, NP  Primary Gastroenterologist: Deatrice Casilda Dine, MD (previously Dr. Golda)  Chief Complaint   No chief complaint on file.  History of Present Illness   Heather Delgado is a 63 y.o. female presenting today at the request of Hyacinth Honey, NP for abnormal imaging of the colon.  Colonoscopy 2008:  1. Few scattered diverticula at sigmoid and descending colon.  2. No evidence of colitis or ileitis.  3. Evidence of colitis or terminal ileitis.  4. Random biopsy taken from sigmoid colon looking for microscopic/collagenous colitis. Biopsies negative.   Seen in May 2020  by Gattis Buba for rectal bleeding, diarrhea. Scheduled for colonoscopy. Symptoms resolved with stopping diclofenac.   Colonoscopy July 2020: - Three small polyps in the sigmoid colon, at the splenic flexure and in the cecum. Biopsied.  - Diverticulosis in the sigmoid colon.  - One 4 mm polyp in the rectum - external hemorrhoids - Path: sessile serrated and hyperplastic polyp - Unclear when repeat recommended.   Labs 11/16/23: H pylori stool negative. Fecal elastase 168, Cdiff negative, stool culture negative.   Seen PCP 11/30/2023 for frequent diarrhea. Taking lomotil and restora probiotic. Reportedly taking OTC pancreatic enzymes? Diarhrea for 8 weeks, reportedly started after last diverticulitis flare. Taking cipro , failed Augmentin?, CT ordered.  CT A/P with contrast 12/06/2023: - Nonobstructive renal stones measure up to 9 mm  - Questionable cecal polyp/mass measures 3.4 x 1.4 cm  - Colonic diverticulosis.   Today:  Discussed the use of AI scribe software for clinical note transcription with the patient, who gave verbal consent to proceed.  Wt Readings from Last 5 Encounters:  07/24/21 (!) 337 lb (152.9 kg)  07/07/21 (!) 337 lb (152.9 kg)  07/01/21 (!) 337 lb (152.9 kg)  08/25/18 (!) 337 lb (152.9 kg)   07/07/18 (!) 346 lb 12.8 oz (157.3 kg)    Current Outpatient Medications  Medication Sig Dispense Refill   acetaminophen  (TYLENOL ) 500 MG tablet Take 1,000 mg by mouth every 6 (six) hours as needed for moderate pain or headache.     albuterol  (PROVENTIL  HFA;VENTOLIN  HFA) 108 (90 BASE) MCG/ACT inhaler Inhale 2 puffs into the lungs daily.     aspirin  EC 81 MG tablet Take 81 mg by mouth daily. Swallow whole.     atenolol  (TENORMIN ) 25 MG tablet Take 25-50 mg by mouth See admin instructions. Take 25 mg every night, on days when experiencing palpitations take 50 mg instead     cetirizine (ZYRTEC) 10 MG tablet Take 10 mg by mouth daily. Alternate with Xyzal     clobetasol ointment (TEMOVATE) 0.05 % Apply 1 application topically daily as needed (lichen sclerosus).      diclofenac (VOLTAREN) 75 MG EC tablet Take 75-150 mg by mouth 2 (two) times daily.     Docusate Calcium (STOOL SOFTENER PO) Take 1 tablet by mouth daily as needed (constipation).     DULoxetine  (CYMBALTA ) 60 MG capsule Take 60 mg by mouth at bedtime.      EPINEPHrine 0.3 mg/0.3 mL IJ SOAJ injection Inject 0.3 mg into the muscle daily as needed for anaphylaxis.      fluticasone (FLONASE) 50 MCG/ACT nasal spray Place 1 spray into both nostrils daily as needed for allergies or rhinitis.     furosemide (LASIX) 20 MG tablet Take 20 mg by mouth daily as needed for edema.     levocetirizine (XYZAL) 5  MG tablet Take 5 mg by mouth every evening. Alternate with Cetirizine     lidocaine  4 % Place 1 patch onto the skin daily as needed (pain).     lisinopril  (ZESTRIL ) 40 MG tablet Take 40 mg by mouth daily.     loperamide (IMODIUM) 2 MG capsule Take 2 mg by mouth as needed for diarrhea or loose stools.     LORazepam (ATIVAN) 0.5 MG tablet Take 0.5 mg by mouth every 4 (four) hours as needed for anxiety.     montelukast (SINGULAIR) 10 MG tablet Take 10 mg by mouth at bedtime.      Multiple Vitamins-Minerals (MULTIVITAMIN WITH MINERALS) tablet Take  1 tablet by mouth daily. Detox  Vit C & d3 Zinc  EgCg Nac     OVER THE COUNTER MEDICATION Take 750 mg by mouth daily. green lipped mussel oil     Polyethyl Glycol-Propyl Glycol (SYSTANE) 0.4-0.3 % SOLN Place 1 drop into both eyes daily as needed (Dry eyes).     Probiotic Product (PROBIOTIC PO) Take 1 capsule by mouth daily.     traMADol (ULTRAM) 50 MG tablet Take 50 mg by mouth every 8 (eight) hours as needed for moderate pain.     trolamine salicylate (ASPERCREME) 10 % cream Apply 1 application. topically as needed for muscle pain.     Trolamine Salicylate (BLUE-EMU HEMP EX) Apply 1 application. topically daily as needed (pain).     TURMERIC CURCUMIN PO Take 2,250 mg by mouth at bedtime.     Vibegron (GEMTESA) 75 MG TABS Take 75 mg by mouth daily.     No current facility-administered medications for this visit.    Past Medical History:  Diagnosis Date   Anxiety    Arthritis    Asthma    Back pain    Complication of anesthesia    desats/ drop in BP after anesthesia   Depression    Diverticulitis    DJD (degenerative joint disease)    Dysrhythmia    Hypertension    IBS (irritable bowel syndrome)    PONV (postoperative nausea and vomiting)    PVC's (premature ventricular contractions)    Reflux    Spondylolysis     Past Surgical History:  Procedure Laterality Date   BIOPSY  08/25/2018   Procedure: BIOPSY;  Surgeon: Golda Claudis PENNER, MD;  Location: [facility];  Service: Endoscopy;;   CARPAL TUNNEL RELEASE     CESAREAN SECTION     CHOLECYSTECTOMY     COLONOSCOPY N/A 08/25/2018   Procedure: COLONOSCOPY;  Surgeon: Golda Claudis PENNER, MD;  Location: [facility];  Service: Endoscopy;  Laterality: N/A;  1030   FINGER SURGERY     I & D EXTREMITY  12/30/2011   Procedure: IRRIGATION AND DEBRIDEMENT EXTREMITY;  Surgeon: Elsie Mussel, MD;  Location: [facility];  Service: Orthopedics;  Laterality: Right;  1st finger   INFECTED SKIN DEBRIDEMENT  12/29/2011   RIGHT INDEX    IR  RADIOLOGIST EVAL & MGMT  03/04/2017   IR VERTEBROPLASTY LUMBAR BX INC UNI/BIL INC/INJECT/IMAGING  03/12/2017   KNEE SURGERY     LIPOMA EXCISION Left 07/09/2021   Procedure: EXCISION LIPOMA- UPPER ARM, LEFT;  Surgeon: Kallie Manuelita BROCKS, MD;  Location: [facility];  Service: General;  Laterality: Left;   MASS EXCISION Left 07/09/2021   Procedure: EXCISION LIPOMA- LOWER BACK, LEFT SIDE;  Surgeon: Kallie Manuelita BROCKS, MD;  Location: [facility];  Service: General;  Laterality: Left;   POLYPECTOMY  08/25/2018  Procedure: POLYPECTOMY;  Surgeon: Golda Claudis PENNER, MD;  Location: [facility];  Service: Endoscopy;;    Family History  Problem Relation Age of Onset   Stroke Maternal Grandmother    Heart disease Maternal Grandfather    Cancer Maternal Grandfather    Alzheimer's disease Maternal Grandfather    Heart disease Paternal Grandmother    Heart disease Paternal Grandfather    Breast cancer Neg Hx     Allergies as of 12/13/2023 - Review Complete 07/24/2021  Allergen Reaction Noted   Duramorph  [morphine ] Rash 10/03/2014   Triamterene Anaphylaxis and Rash 09/07/2011   Gabapentin Other (See Comments) 05/27/2017   Lyrica [pregabalin]  08/19/2018   Thimerosal (thiomersal)  08/19/2018   Latex Rash 02/15/2013    Social History   Socioeconomic History   Marital status: Married    Spouse name: Not on file   Number of children: Not on file   Years of education: Not on file   Highest education level: Not on file  Occupational History   Not on file  Tobacco Use   Smoking status: Never   Smokeless tobacco: Never  Vaping Use   Vaping status: Never Used  Substance and Sexual Activity   Alcohol use: No    Comment: occ   Drug use: No   Sexual activity: Yes  Other Topics Concern   Not on file  Social History Narrative   Not on file   Social Drivers of Health   Financial Resource Strain: Not on file  Food Insecurity: Not on file  Transportation Needs: Not on file  Physical Activity: Not on  file  Stress: Not on file  Social Connections: Not on file  Intimate Partner Violence: Not on file     Review of Systems   Gen: Denies any fever, chills, fatigue, weight loss, lack of appetite.  CV: Denies chest pain, heart palpitations, peripheral edema, syncope.  Resp: Denies shortness of breath at rest or with exertion. Denies wheezing or cough.  GI: see HPI GU : Denies urinary burning, urinary frequency, urinary hesitancy MS: Denies joint pain, muscle weakness, cramps, or limitation of movement.  Derm: Denies rash, itching, dry skin Psych: Denies depression, anxiety, memory loss, and confusion Heme: Denies bruising, bleeding, and enlarged lymph nodes.  Physical Exam   There were no vitals taken for this visit.  General:   Alert and oriented. Pleasant and cooperative. Well-nourished and well-developed.  Head:  Normocephalic and atraumatic. Eyes:  Without icterus, sclera clear and conjunctiva pink.  Ears:  Normal auditory acuity. Mouth:  No deformity or lesions, oral mucosa pink.  Lungs:  Clear to auscultation bilaterally. No wheezes, rales, or rhonchi. No distress.  Heart:  S1, S2 present without murmurs appreciated.  Abdomen:  +BS, soft, non-tender and non-distended. No HSM noted. No guarding or rebound. No masses appreciated.  Rectal:  deferred *** Msk:  Symmetrical without gross deformities. Normal posture. Extremities:  Without edema. Neurologic:  Alert and  oriented x4;  grossly normal neurologically. Skin:  Intact without significant lesions or rashes. Psych:  Alert and cooperative. Normal mood and affect.  Assessment   Heather Delgado is a 63 y.o. female with a history of HTN, prediabetes, IBS, depression, HLD, obesity, lipomatosis, restless less syndrome, polycythemia vera, fibromyalgia, MASLD, osteoarthritis, and overactive bladder presenting today with ***     PLAN   *** Stop otc enzymes, trial Creon weight based colonoscopy Follow up ***   Charmaine Melia, MSN, FNP-BC, AGACNP-BC [facility]

## 2023-12-13 ENCOUNTER — Encounter: Payer: Self-pay | Admitting: Gastroenterology

## 2023-12-13 ENCOUNTER — Other Ambulatory Visit: Payer: Self-pay | Admitting: *Deleted

## 2023-12-13 ENCOUNTER — Ambulatory Visit: Admitting: Gastroenterology

## 2023-12-13 ENCOUNTER — Other Ambulatory Visit: Payer: Self-pay

## 2023-12-13 ENCOUNTER — Encounter: Payer: Self-pay | Admitting: *Deleted

## 2023-12-13 VITALS — BP 109/67 | HR 70 | Temp 98.6°F | Ht 64.0 in | Wt 317.8 lb

## 2023-12-13 DIAGNOSIS — R1032 Left lower quadrant pain: Secondary | ICD-10-CM

## 2023-12-13 DIAGNOSIS — R933 Abnormal findings on diagnostic imaging of other parts of digestive tract: Secondary | ICD-10-CM

## 2023-12-13 DIAGNOSIS — K8681 Exocrine pancreatic insufficiency: Secondary | ICD-10-CM | POA: Diagnosis not present

## 2023-12-13 DIAGNOSIS — Z860100 Personal history of colon polyps, unspecified: Secondary | ICD-10-CM

## 2023-12-13 DIAGNOSIS — K625 Hemorrhage of anus and rectum: Secondary | ICD-10-CM

## 2023-12-13 DIAGNOSIS — K580 Irritable bowel syndrome with diarrhea: Secondary | ICD-10-CM

## 2023-12-13 DIAGNOSIS — K5732 Diverticulitis of large intestine without perforation or abscess without bleeding: Secondary | ICD-10-CM

## 2023-12-13 DIAGNOSIS — K5730 Diverticulosis of large intestine without perforation or abscess without bleeding: Secondary | ICD-10-CM | POA: Diagnosis not present

## 2023-12-13 MED ORDER — PEG 3350-KCL-NA BICARB-NACL 420 G PO SOLR: 4000.0000 mL | Freq: Once | ORAL | 0 refills | Status: AC

## 2023-12-13 MED ORDER — ZENPEP 40000-126000 UNITS PO CPEP: 40000.0000 [IU] | ORAL_CAPSULE | Freq: Three times a day (TID) | ORAL | Status: DC

## 2023-12-13 NOTE — Progress Notes (Signed)
 Medication Samples have been provided to the patient.  Drug name: ZENPEP       Strength: 40000        Qty: 7  LOT: [id number]  Exp.Date: 05/2025  Dosing instructions: TAKE 2 CAPS WITH MEALS 3 TIMES DAILY  The patient has been instructed regarding the correct time, dose, and frequency of taking this medication, including desired effects and most common side effects.   Arliene Rosenow I Emmersen Garraway 12:52 PM 12/13/2023

## 2023-12-13 NOTE — Patient Instructions (Addendum)
 We will get you scheduled for colonoscopy with Dr. Cinderella in the near future.   Instead of giving you Creon where we would need to have you take higher number of tablets I am going to give you Zenpep 40000 u capsules. You will take 2 of these with meals at least 3 times daily.  If you tolerate this well without constipation then you will be able to take 1 Zenpep with a snack.  This is helpful please let me know and I will send in prescription.  While on Zenpep if you are having a significant mount of diarrhea still please let me know and you may continue to take Imodium over-the-counter as needed.  We will see you for follow-up after your procedure.  It was a pleasure to see you today. I want to create trusting relationships with patients. If you receive a survey regarding your visit,  I greatly appreciate you taking time to fill this out on paper or through your MyChart. I value your feedback.  Charmaine Melia, MSN, FNP-BC, AGACNP-BC [facility]

## 2023-12-16 ENCOUNTER — Telehealth: Payer: Self-pay | Admitting: *Deleted

## 2023-12-16 NOTE — Telephone Encounter (Signed)
 Pt called and stated that she seen blood yesterday when she wiped, but she did not have a bowel movement. This was the first time this had happened. She is scheduled for a colonoscopy on 11/6. Please advise

## 2023-12-21 ENCOUNTER — Other Ambulatory Visit: Payer: Self-pay | Admitting: Gastroenterology

## 2023-12-21 DIAGNOSIS — I872 Venous insufficiency (chronic) (peripheral): Secondary | ICD-10-CM | POA: Diagnosis not present

## 2023-12-21 DIAGNOSIS — E882 Lipomatosis, not elsewhere classified: Secondary | ICD-10-CM | POA: Diagnosis not present

## 2023-12-21 MED ORDER — ZENPEP 40000-126000 UNITS PO CPEP: ORAL_CAPSULE | ORAL | 3 refills | Status: AC

## 2023-12-21 NOTE — Telephone Encounter (Signed)
 Spoke to pt, there has been no more bleeding since last Wednesday. But, she has had increased pain in her left side. She needs Zenpep called into her pharmacy.

## 2023-12-27 ENCOUNTER — Encounter (HOSPITAL_COMMUNITY): Payer: Self-pay

## 2023-12-27 ENCOUNTER — Other Ambulatory Visit: Payer: Self-pay

## 2023-12-27 ENCOUNTER — Encounter (HOSPITAL_COMMUNITY)
Admission: RE | Admit: 2023-12-27 | Discharge: 2023-12-27 | Disposition: A | Discharge status: Home / Self Care | Source: Ambulatory Visit | Attending: Gastroenterology | Admitting: Gastroenterology

## 2023-12-27 HISTORY — DX: Fibromyalgia: M79.7

## 2023-12-27 NOTE — Pre-Procedure Instructions (Signed)
 Attempted pre-op phonecall. Left VM for her to call us  back.

## 2023-12-30 ENCOUNTER — Encounter (HOSPITAL_COMMUNITY): Payer: Self-pay | Admitting: Gastroenterology

## 2023-12-30 ENCOUNTER — Ambulatory Visit (HOSPITAL_COMMUNITY): Admitting: Anesthesiology

## 2023-12-30 ENCOUNTER — Other Ambulatory Visit: Payer: Self-pay

## 2023-12-30 ENCOUNTER — Ambulatory Visit (HOSPITAL_COMMUNITY)
Admission: RE | Admit: 2023-12-30 | Discharge: 2023-12-30 | Disposition: A | Discharge status: Home / Self Care | Attending: Gastroenterology | Admitting: Gastroenterology

## 2023-12-30 ENCOUNTER — Telehealth: Payer: Self-pay | Admitting: *Deleted

## 2023-12-30 ENCOUNTER — Encounter (HOSPITAL_COMMUNITY): Admission: RE | Disposition: A | Payer: Self-pay | Source: Home / Self Care | Attending: Gastroenterology

## 2023-12-30 DIAGNOSIS — K644 Residual hemorrhoidal skin tags: Secondary | ICD-10-CM

## 2023-12-30 DIAGNOSIS — K8689 Other specified diseases of pancreas: Secondary | ICD-10-CM | POA: Diagnosis not present

## 2023-12-30 DIAGNOSIS — Z79899 Other long term (current) drug therapy: Secondary | ICD-10-CM | POA: Insufficient documentation

## 2023-12-30 DIAGNOSIS — R195 Other fecal abnormalities: Secondary | ICD-10-CM

## 2023-12-30 DIAGNOSIS — J45909 Unspecified asthma, uncomplicated: Secondary | ICD-10-CM | POA: Insufficient documentation

## 2023-12-30 DIAGNOSIS — R7303 Prediabetes: Secondary | ICD-10-CM

## 2023-12-30 DIAGNOSIS — F32A Depression, unspecified: Secondary | ICD-10-CM | POA: Insufficient documentation

## 2023-12-30 DIAGNOSIS — R933 Abnormal findings on diagnostic imaging of other parts of digestive tract: Secondary | ICD-10-CM

## 2023-12-30 DIAGNOSIS — K5730 Diverticulosis of large intestine without perforation or abscess without bleeding: Secondary | ICD-10-CM | POA: Diagnosis not present

## 2023-12-30 DIAGNOSIS — K529 Noninfective gastroenteritis and colitis, unspecified: Secondary | ICD-10-CM | POA: Diagnosis not present

## 2023-12-30 DIAGNOSIS — I10 Essential (primary) hypertension: Secondary | ICD-10-CM | POA: Insufficient documentation

## 2023-12-30 DIAGNOSIS — Z6841 Body Mass Index (BMI) 40.0 and over, adult: Secondary | ICD-10-CM | POA: Diagnosis not present

## 2023-12-30 DIAGNOSIS — F419 Anxiety disorder, unspecified: Secondary | ICD-10-CM | POA: Diagnosis not present

## 2023-12-30 DIAGNOSIS — R1084 Generalized abdominal pain: Secondary | ICD-10-CM | POA: Diagnosis not present

## 2023-12-30 DIAGNOSIS — E669 Obesity, unspecified: Secondary | ICD-10-CM | POA: Diagnosis not present

## 2023-12-30 DIAGNOSIS — R1032 Left lower quadrant pain: Secondary | ICD-10-CM

## 2023-12-30 DIAGNOSIS — Z860100 Personal history of colon polyps, unspecified: Secondary | ICD-10-CM

## 2023-12-30 DIAGNOSIS — K52832 Lymphocytic colitis: Secondary | ICD-10-CM

## 2023-12-30 DIAGNOSIS — K625 Hemorrhage of anus and rectum: Secondary | ICD-10-CM

## 2023-12-30 DIAGNOSIS — F418 Other specified anxiety disorders: Secondary | ICD-10-CM | POA: Diagnosis not present

## 2023-12-30 DIAGNOSIS — K648 Other hemorrhoids: Secondary | ICD-10-CM | POA: Diagnosis not present

## 2023-12-30 HISTORY — PX: COLONOSCOPY: SHX5424

## 2023-12-30 SURGERY — COLONOSCOPY
Anesthesia: General

## 2023-12-30 MED ORDER — LACTATED RINGERS IV SOLN: INTRAVENOUS | Status: DC

## 2023-12-30 MED ORDER — PROPOFOL 10 MG/ML IV BOLUS
INTRAVENOUS | Status: DC | PRN
  Administered 2023-12-30: 50 mg via INTRAVENOUS
  Administered 2023-12-30: 100 mg via INTRAVENOUS
  Administered 2023-12-30: 50 mg via INTRAVENOUS

## 2023-12-30 MED ORDER — PROPOFOL 500 MG/50ML IV EMUL
INTRAVENOUS | Status: DC | PRN
  Administered 2023-12-30: 200 ug/kg/min via INTRAVENOUS

## 2023-12-30 NOTE — Telephone Encounter (Signed)
-----   Message from Heather Delgado sent at 12/30/2023  1:07 PM EST ----- Regarding: CT Hi Therron Sells/Tammy,   Can you please schedule a Ct Abdomen pelvis  in 1-2 weeks ? Dx: Cecal mass/abnormal CT previously .   Thanks,  Muhammad Faizan Ahmed, MD Gastroenterology and Hepatology [facility]

## 2023-12-30 NOTE — Interval H&P Note (Signed)
 History and Physical Interval Note:  12/30/2023 10:46 AM  Heather Delgado  has presented today for surgery, with the diagnosis of hx: polyps,cecal mass/polyp on imaging,rectal bleeding.  The various methods of treatment have been discussed with the patient and family. After consideration of risks, benefits and other options for treatment, the patient has consented to  Procedure(s) with comments: COLONOSCOPY (N/A) - 11:15 am, asa 3 as a surgical intervention.  The patient's history has been reviewed, patient examined, no change in status, stable for surgery.  I have reviewed the patient's chart and labs.  Questions were answered to the patient's satisfaction.     Deatrice FALCON Ahlijah Raia

## 2023-12-30 NOTE — Telephone Encounter (Signed)
 PA approved via carelon Order ID: [id number]       Authorized Approval Valid Through: 12/30/2023 - 01/28/2024  CT scheduled for 11/13, arrival 2:45pm.

## 2023-12-30 NOTE — Transfer of Care (Signed)
 Immediate Anesthesia Transfer of Care Note  Patient: Heather Delgado  Procedure(s) Performed: COLONOSCOPY  Patient Location: Short Stay  Anesthesia Type:General  Level of Consciousness: awake, alert , oriented, and patient cooperative  Airway & Oxygen Therapy: Patient Spontanous Breathing  Post-op Assessment: Report given to RN, Post -op Vital signs reviewed and stable, and Patient moving all extremities X 4  Post vital signs: Reviewed and stable  Last Vitals:  Vitals Value Taken Time  BP    Temp 36.5 C 12/30/23 13:10  Pulse 74 12/30/23 13:10  Resp 20 12/30/23 13:10  SpO2      Last Pain:  Vitals:   12/30/23 1310  TempSrc: Oral  PainSc: 0-No pain      Patients Stated Pain Goal: 5 (12/30/23 1310)  Complications: No notable events documented.

## 2023-12-30 NOTE — Discharge Instructions (Signed)

## 2023-12-30 NOTE — Anesthesia Postprocedure Evaluation (Signed)
 Anesthesia Post Note  Patient: Heather Delgado  Procedure(s) Performed: COLONOSCOPY  Patient location during evaluation: Phase II Anesthesia Type: General Level of consciousness: awake and alert Pain management: pain level controlled Vital Signs Assessment: post-procedure vital signs reviewed and stable Respiratory status: spontaneous breathing, nonlabored ventilation and respiratory function stable Cardiovascular status: stable Anesthetic complications: no   There were no known notable events for this encounter.   Last Vitals:  Vitals:   12/30/23 1052 12/30/23 1310  BP: (!) 151/95   Pulse: 76 74  Resp: 19 20  Temp: 36.7 C 36.5 C  SpO2: 97%     Last Pain:  Vitals:   12/30/23 1310  TempSrc: Oral  PainSc: 0-No pain                 Bernd Crom L Katanya Schlie

## 2023-12-30 NOTE — Anesthesia Preprocedure Evaluation (Addendum)
 Anesthesia Evaluation  Patient identified by MRN, date of birth, ID band Patient awake    Reviewed: Allergy & Precautions, NPO status , Patient's Chart, lab work & pertinent test results  History of Anesthesia Complications (+) PONV and history of anesthetic complications  Airway Mallampati: I  TM Distance: >3 FB Neck ROM: Full    Dental  (+) Dental Advisory Given, Missing Most of her teeth are intact.  Some missing in the back:   Pulmonary shortness of breath and with exertion, asthma    Pulmonary exam normal breath sounds clear to auscultation       Cardiovascular Exercise Tolerance: Good hypertension, Pt. on medications Normal cardiovascular exam(-) dysrhythmias (PVCs)  Rhythm:Regular Rate:Normal  PVCs   Neuro/Psych  PSYCHIATRIC DISORDERS Anxiety Depression       GI/Hepatic negative GI ROS, Neg liver ROS,,,  Endo/Other    Class 4 obesity  Renal/GU negative Renal ROS     Musculoskeletal  (+) Arthritis , Osteoarthritis,  Fibromyalgia -  Abdominal   Peds  Hematology   Anesthesia Other Findings   Reproductive/Obstetrics                              Anesthesia Physical Anesthesia Plan  ASA: 4  Anesthesia Plan: General   Post-op Pain Management: Minimal or no pain anticipated   Induction: Intravenous  PONV Risk Score and Plan: Propofol  infusion  Airway Management Planned: Nasal Cannula and Natural Airway  Additional Equipment: None  Intra-op Plan:   Post-operative Plan:   Informed Consent: I have reviewed the patients History and Physical, chart, labs and discussed the procedure including the risks, benefits and alternatives for the proposed anesthesia with the patient or authorized representative who has indicated his/her understanding and acceptance.     Dental advisory given  Plan Discussed with: CRNA  Anesthesia Plan Comments:          Anesthesia Quick  Evaluation

## 2023-12-30 NOTE — Op Note (Signed)
 [facility] Patient Name: Heather Delgado Procedure Date: 12/30/2023 11:29 AM MRN: [id number] Date of Birth: Jan 23, 1961 Attending MD: Deatrice Dine , MD, [id number] CSN: [id number] Age: 63 Admit Type: Outpatient Procedure:                Colonoscopy Indications:              Generalized abdominal pain, Chronic diarrhea,                            Abnormal CT of the GI tract Providers:                Deatrice Dine, MD, Harlene Lips, Bascom Blush Referring MD:              Medicines:                Monitored Anesthesia Care Complications:            No immediate complications. Estimated Blood Loss:     Estimated blood loss was minimal. Procedure:                Pre-Anesthesia Assessment:                           - Prior to the procedure, a History and Physical                            was performed, and patient medications and                            allergies were reviewed. The patient's tolerance of                            previous anesthesia was also reviewed. The risks                            and benefits of the procedure and the sedation                            options and risks were discussed with the patient.                            All questions were answered, and informed consent                            was obtained. Prior Anticoagulants: The patient has                            taken no anticoagulant or antiplatelet agents                            except for aspirin . ASA Grade Assessment: II - A                            patient with mild systemic disease. After reviewing  the risks and benefits, the patient was deemed in                            satisfactory condition to undergo the procedure.                           After obtaining informed consent, the colonoscope                            was passed under direct vision. Throughout the                            procedure, the patient's blood pressure, pulse,  and                            oxygen saturations were monitored continuously. The                            CH-[id number] ([id number]) Colon was introduced through                            the anus and advanced to the the terminal ileum.                            The terminal ileum, ileocecal valve, appendiceal                            orifice, and rectum were photographed. The quality                            of the bowel preparation was evaluated using the                            BBPS [facility] Bowel Preparation Scale) with scores                            of: Right Colon = 2 (minor amount of residual                            staining, small fragments of stool and/or opaque                            liquid, but mucosa seen well), Transverse Colon = 2                            (minor amount of residual staining, small fragments                            of stool and/or opaque liquid, but mucosa seen                            well) and Left Colon = 2 (minor amount of residual  staining, small fragments of stool and/or opaque                            liquid, but mucosa seen well). The total BBPS score                            equals 6. Scope In: 12:30:09 PM Scope Out: 1:03:14 PM Scope Withdrawal Time: 0 hours 28 minutes 50 seconds  Total Procedure Duration: 0 hours 33 minutes 5 seconds  Findings:      The perianal and digital rectal examinations were normal.      There is no endoscopic evidence of mass or polyps in the cecum and in       the entire colon.      A moderate amount of stool was found in the entire colon, precluding       visualization. Lavage of the area was performed using a large amount of       sterile water, resulting in clearance with good visualization.      Scattered medium-mouthed diverticula were found in the left colon.      There is no endoscopic evidence of inflammation in the entire colon.       Biopsies for histology were  taken with a cold forceps for evaluation of       microscopic colitis.      Non-bleeding external internal hemorrhoids were found during       retroflexion. The hemorrhoids were small.      The terminal ileum appeared normal. Impression:               - Stool in the entire examined colon; adeqaute exam                            for clinical indication of the procedure                           - Diverticulosis in the left colon.                           - Non-bleeding external internal hemorrhoids.                           - The examined portion of the ileum was normal.                           -No cecal or ascending colon mass was seen en-face                            and retroflexion view Moderate Sedation:      Per Anesthesia Care Recommendation:           - Patient has a contact number available for                            emergencies. The signs and symptoms of potential                            delayed complications were discussed with the  patient. Return to normal activities tomorrow.                            Written discharge instructions were provided to the                            patient.                           - Resume previous diet.                           - Continue present medications.                           - Await pathology results.                           - Repeat colonoscopy in 5 years for surveillance.                           - Return to GI office as previously scheduled.                           -Consider repeat CT to ensure the findings have                            resolved Procedure Code(s):        --- Professional ---                           [phone], Colonoscopy, flexible; with biopsy, single                            or multiple Diagnosis Code(s):        --- Professional ---                           K64.4, Residual hemorrhoidal skin tags                           K64.8, Other hemorrhoids                            R10.84, Generalized abdominal pain                           K52.9, Noninfective gastroenteritis and colitis,                            unspecified                           K57.30, Diverticulosis of large intestine without                            perforation or abscess without bleeding  R93.3, Abnormal findings on diagnostic imaging of                            other parts of digestive tract CPT copyright 2022 American Medical Association. All rights reserved. The codes documented in this report are preliminary and upon coder review may  be revised to meet current compliance requirements. Deatrice Dine, MD Deatrice Dine, MD 12/30/2023 1:15:35 PM This report has been signed electronically. Number of Addenda: 0

## 2023-12-31 ENCOUNTER — Encounter (HOSPITAL_COMMUNITY): Payer: Self-pay | Admitting: Gastroenterology

## 2023-12-31 LAB — SURGICAL PATHOLOGY

## 2024-01-03 LAB — HM COLONOSCOPY

## 2024-01-04 ENCOUNTER — Ambulatory Visit (INDEPENDENT_AMBULATORY_CARE_PROVIDER_SITE_OTHER): Payer: Self-pay | Admitting: Gastroenterology

## 2024-01-04 ENCOUNTER — Encounter (INDEPENDENT_AMBULATORY_CARE_PROVIDER_SITE_OTHER): Payer: Self-pay | Admitting: *Deleted

## 2024-01-04 DIAGNOSIS — K52832 Lymphocytic colitis: Secondary | ICD-10-CM

## 2024-01-04 MED ORDER — BUDESONIDE ER 9 MG PO TB24: 9.0000 mg | ORAL_TABLET | Freq: Every day | ORAL | 0 refills | Status: DC

## 2024-01-05 ENCOUNTER — Telehealth (INDEPENDENT_AMBULATORY_CARE_PROVIDER_SITE_OTHER): Payer: Self-pay | Admitting: Gastroenterology

## 2024-01-05 NOTE — Progress Notes (Signed)
 Patient result letter mailed 5 yr TCS noted in recall procedure note and pathology result faxed to PCP

## 2024-01-05 NOTE — Telephone Encounter (Signed)
 Yes can be  switched , with same directions as previous script

## 2024-01-05 NOTE — Telephone Encounter (Signed)
 PA received and completed via Cover My Meds for Budesonide 3 mg DR capsules.  Await decision. (Please see message from earlier today)

## 2024-01-05 NOTE — Telephone Encounter (Signed)
 Spoke with Robin. Gave verbal ok to change to 3 mg.

## 2024-01-05 NOTE — Telephone Encounter (Signed)
 Robin from [facility] left voicemail stating they received prescription for Budesonide ER 9 mg. They are unable to get that; they can get 3 mg capsule. [place] asked that we either send over a new script or call them to authorize change, if ok to switch. Please advise. Thank you.

## 2024-01-05 NOTE — Telephone Encounter (Signed)
 Approved today by [employer] [place] [employer] 2017 Approved. Effective Date: 01/05/2024 Authorization Expiration Date: 01/04/2025

## 2024-01-06 ENCOUNTER — Ambulatory Visit (HOSPITAL_COMMUNITY)

## 2024-01-16 NOTE — Progress Notes (Unsigned)
 GI Office Note    Referring Provider: Hyacinth Honey, NP Primary Care Physician:  Shona Norleen PEDLAR, MD Primary Gastroenterologist: Deatrice Casilda Dine, MD  Date:  01/17/2024  ID:  Erminio GORMAN Free, DOB [birth year], MRN [id number]   Chief Complaint   Chief Complaint  Patient presents with   Follow-up    Follow up. Diarrhea is better, but still has some.   History of Present Illness  Heather Delgado is a 63 y.o. female with a history of pancreatic insufficiency, HTN, prediabetes, IBS, depression, HLD, obesity, lipomatosis, restless less syndrome, polycythemia vera, fibromyalgia, MASLD, osteoarthritis, and overactive bladder  presenting today with still some diarrhea.   Colonoscopy 2008:  1. Few scattered diverticula at sigmoid and descending colon.  2. No evidence of colitis or ileitis.  3. Evidence of colitis or terminal ileitis.  4. Random biopsy taken from sigmoid colon looking for microscopic/collagenous colitis. Biopsies negative.    Seen in May 2020  by Gattis Buba for rectal bleeding, diarrhea. Scheduled for colonoscopy. Symptoms resolved with stopping diclofenac.    Colonoscopy July 2020: - Three small polyps in the sigmoid colon, at the splenic flexure and in the cecum. Biopsied.  - Diverticulosis in the sigmoid colon.  - One 4 mm polyp in the rectum - external hemorrhoids - Path: sessile serrated and hyperplastic polyp - Unclear when repeat recommended.    Labs 11/16/23: H pylori stool negative. Fecal elastase 168, Cdiff negative, stool culture negative.    Seen PCP 11/30/2023 for frequent diarrhea. Taking lomotil and restora probiotic. Reportedly taking OTC pancreatic enzymes? Diarhrea for 8 weeks, reportedly started after last diverticulitis flare. Taking cipro , failed Augmentin?, CT ordered.  CT A/P with contrast 12/06/2023: - Nonobstructive renal stones measure up to 9 mm  - Questionable cecal polyp/mass measures 3.4 x 1.4 cm  - Colonic diverticulosis.  Last office  visit 12/13/23.  She initially thought her diarrhea secondary to stomach virus.  She has a history of IBS at age 2 but was predominantly diarrhea over constipation.  Had tried various things including a gluten-free diet, liquid diet, and BRAT diet without any significant improvement.  She was going 20+ times after the initial viral exposure.  At the office visit she was experiencing 5-7 bowel movements daily with large amounts usually in the mornings.  She states she had experience episodes of rectal bleeding with last occurrence a week prior which she states was either not associated with her small external hemorrhoid.  Has history of polyps concerned about a new polyp. Gave samples of Zenpep  for EPI, scheduled for colonoscopy to evaluate CT findings and rectal bleeding. Advised hemorrhoid cream that rectal bleeding could be secondary to this or her imaging findings.  Zenpep  prescription sent on 10/28 given patient reported it was helping.   Colonoscopy 12/30/2023: - Stool in the entire examined colon; adeqaute exam for clinical indication of the procedure after lavage - Diverticulosis in the left colon.  - TI normal - Non- bleeding external internal hemorrhoids.  - The examined portion of the ileum was normal.  - No cecal or ascending colon mass was seen on face and retroflexion view - random biopsies taken.  - Path: lymphocytic colitis, patchy mild subepithelial collagen deposits - Recommended repeat CT - Repeat colonoscopy in 5 years.   Given lymphocytic colitis she was recommended to avoid NSAIDs and PPIs and given prescription for budesonide  9 mg daily for 1 month supply.   Today: Discussed the use of AI scribe software for clinical note  transcription with the patient, who gave verbal consent to proceed.  She has a long-standing history of IBS with diarrhea. Earlier this year, she was found to have low fecal elastase and was started on pancreatic enzymes, which improved her symptoms to  some extent. Recently, she underwent a colonoscopy that revealed lymphocytic colitis, and she was started on budesonide .  Since starting budesonide , she has experienced significant improvement in her diarrhea, stating 'I don't have diarrhea anymore.' She now has three to four bowel movements a day, which are more formed, described as 'long, soft ribbons.' Previously, she experienced frequent bowel movements, often coinciding with urination.  She is currently taking both budesonide  and Zenpep . Her budesonide  regimen is set for eight weeks, starting with three pills a day, tapering to two pills a day. Regarding her pancreatic insufficiency, she has been taking Zenpep , which has helped reduce her diarrhea and improve nutrient absorption. She notes feeling less fatigue and observes improvements in her nail growth and reduction of dark circles under her eyes.  She consumes minimal alcohol, never more than one drink a day, and only a few times a year. She has not experienced significant weight loss despite her diarrhea, which she attributes to her body's 'survival mode' of holding onto nutrients.  Her current medication regimen includes taking one Zenpep  with each meal and snacks. She is also taking psyllium husk (1 capsule) with meals for additional fiber intake. She consumes a diet rich in vegetables and has been eating two kiwis a day for their fiber content. No constipation and maintains a good appetite, and feels less fatigue since starting her current treatment regimen.      Wt Readings from Last 6 Encounters:  01/17/24 (!) 319 lb 3.2 oz (144.8 kg)  12/30/23 (!) 317 lb 14.5 oz (144.2 kg)  12/27/23 (!) 317 lb 12.8 oz (144.2 kg)  12/13/23 (!) 317 lb 12.8 oz (144.2 kg)  07/24/21 (!) 337 lb (152.9 kg)  07/07/21 (!) 337 lb (152.9 kg)    Body mass index is 54.79 kg/m.   Current Outpatient Medications  Medication Sig Dispense Refill   acetaminophen  (TYLENOL ) 500 MG tablet Take 1,000 mg by mouth  every 6 (six) hours as needed for moderate pain or headache.     albuterol  (PROVENTIL  HFA;VENTOLIN  HFA) 108 (90 BASE) MCG/ACT inhaler Inhale 2 puffs into the lungs daily.     atenolol  (TENORMIN ) 25 MG tablet Take 25-50 mg by mouth See admin instructions. Take 25 mg every night, on days when experiencing palpitations take 50 mg instead     Budesonide  ER (UCERIS ) 9 MG TB24 Take 1 tablet (9 mg total) by mouth daily. Oral budesonide  9mg  daily for 8 weeks. This will be tapered to 6mg  for two weeks , followed by 3mg  for another two weeks and then discontinued 90 tablet 0   cetirizine (ZYRTEC) 10 MG tablet Take 10 mg by mouth daily. Alternate with Xyzal     Cholecalciferol  (VITAMIN D) 125 MCG CAPS Take by mouth.     clobetasol ointment (TEMOVATE) 0.05 % Apply 1 application topically daily as needed (lichen sclerosus).      D-Mannose 500 MG CAPS Take 1,000 mg by mouth daily.     DULoxetine  (CYMBALTA ) 60 MG capsule Take 60 mg by mouth at bedtime.      EPINEPHrine 0.3 mg/0.3 mL IJ SOAJ injection Inject 0.3 mg into the muscle daily as needed for anaphylaxis.      fluticasone (FLONASE) 50 MCG/ACT nasal spray Place 1 spray into  both nostrils daily as needed for allergies or rhinitis.     lidocaine  4 % Place 1 patch onto the skin daily as needed (pain).     lisinopril  (ZESTRIL ) 40 MG tablet Take 40 mg by mouth daily.     loperamide (IMODIUM) 2 MG capsule Take 2 mg by mouth as needed for diarrhea or loose stools.     montelukast (SINGULAIR) 10 MG tablet Take 10 mg by mouth at bedtime.      Omega 3 1000 MG CAPS Take by mouth.     Pancrelipase , Lip-Prot-Amyl, (ZENPEP ) 40000-126000 units CPEP Take 1 capsule (40,000 Units total) by mouth in the morning, at noon, and at bedtime. May take as well with up to 3 snacks daily. 300 capsule 3   Polyethyl Glycol-Propyl Glycol (SYSTANE) 0.4-0.3 % SOLN Place 1 drop into both eyes daily as needed (Dry eyes).     Probiotic Product (PROBIOTIC PO) Take 1 capsule by mouth daily.      traMADol (ULTRAM) 50 MG tablet Take 50 mg by mouth every 8 (eight) hours as needed for moderate pain.     trolamine salicylate (ASPERCREME) 10 % cream Apply 1 application. topically as needed for muscle pain.     Trolamine Salicylate (BLUE-EMU HEMP EX) Apply 1 application. topically daily as needed (pain).     TURMERIC CURCUMIN PO Take 2,250 mg by mouth at bedtime.     Vibegron (GEMTESA) 75 MG TABS Take 75 mg by mouth daily.     zinc gluconate 50 MG tablet Take 50 mg by mouth daily.     aspirin  EC 81 MG tablet Take 81 mg by mouth daily. Swallow whole. (Patient not taking: Reported on 01/17/2024)     diclofenac (VOLTAREN) 75 MG EC tablet Take 75-150 mg by mouth 2 (two) times daily. (Patient not taking: Reported on 01/17/2024)     Docusate Calcium (STOOL SOFTENER PO) Take 1 tablet by mouth daily as needed (constipation). (Patient not taking: Reported on 12/13/2023)     levocetirizine (XYZAL) 5 MG tablet Take 5 mg by mouth every evening. Alternate with Cetirizine (Patient not taking: Reported on 12/13/2023)     LORazepam (ATIVAN) 0.5 MG tablet Take 0.5 mg by mouth every 4 (four) hours as needed for anxiety. (Patient not taking: Reported on 12/13/2023)     No current facility-administered medications for this visit.    Past Medical History:  Diagnosis Date   Anxiety    Arthritis    Asthma    Back pain    Complication of anesthesia    desats/ drop in BP after anesthesia   Depression    Diverticulitis    DJD (degenerative joint disease)    Dysrhythmia    Fibromyalgia    Hypertension    IBS (irritable bowel syndrome)    PONV (postoperative nausea and vomiting)    PVC's (premature ventricular contractions)    Reflux    Spondylolysis     Past Surgical History:  Procedure Laterality Date   BIOPSY  08/25/2018   Procedure: BIOPSY;  Surgeon: Golda Claudis PENNER, MD;  Location: [facility];  Service: Endoscopy;;   CARPAL TUNNEL RELEASE     CESAREAN SECTION     CHOLECYSTECTOMY      COLONOSCOPY N/A 08/25/2018   Procedure: COLONOSCOPY;  Surgeon: Golda Claudis PENNER, MD;  Location: [facility];  Service: Endoscopy;  Laterality: N/A;  1030   COLONOSCOPY N/A 12/30/2023   Procedure: COLONOSCOPY;  Surgeon: Cinderella Deatrice FALCON, MD;  Location: [facility];  Service: Endoscopy;  Laterality: N/A;  11:15 am, asa 3   FINGER SURGERY     I & D EXTREMITY  12/30/2011   Procedure: IRRIGATION AND DEBRIDEMENT EXTREMITY;  Surgeon: Elsie Mussel, MD;  Location: [facility];  Service: Orthopedics;  Laterality: Right;  1st finger   INFECTED SKIN DEBRIDEMENT  12/29/2011   RIGHT INDEX    IR RADIOLOGIST EVAL & MGMT  03/04/2017   IR VERTEBROPLASTY LUMBAR BX INC UNI/BIL INC/INJECT/IMAGING  03/12/2017   KNEE SURGERY     LIPOMA EXCISION Left 07/09/2021   Procedure: EXCISION LIPOMA- UPPER ARM, LEFT;  Surgeon: Kallie Manuelita BROCKS, MD;  Location: [facility];  Service: General;  Laterality: Left;   MASS EXCISION Left 07/09/2021   Procedure: EXCISION LIPOMA- LOWER BACK, LEFT SIDE;  Surgeon: Kallie Manuelita BROCKS, MD;  Location: [facility];  Service: General;  Laterality: Left;   POLYPECTOMY  08/25/2018   Procedure: POLYPECTOMY;  Surgeon: Golda Claudis PENNER, MD;  Location: [facility];  Service: Endoscopy;;    Family History  Problem Relation Age of Onset   Stroke Maternal Grandmother    Heart disease Maternal Grandfather    Cancer Maternal Grandfather    Alzheimer's disease Maternal Grandfather    Heart disease Paternal Grandmother    Heart disease Paternal Grandfather    Breast cancer Neg Hx     Allergies as of 01/17/2024 - Review Complete 01/17/2024  Allergen Reaction Noted   Duramorph  [morphine ] Rash 10/03/2014   Triamterene Anaphylaxis and Rash 09/07/2011   Gabapentin Other (See Comments) 05/27/2017   Lyrica [pregabalin]  08/19/2018   Thimerosal (thiomersal)  08/19/2018   Latex Rash 02/15/2013    Social History   Socioeconomic History   Marital status: Married    Spouse name: Not on file   Number of  children: Not on file   Years of education: Not on file   Highest education level: Not on file  Occupational History   Not on file  Tobacco Use   Smoking status: Never   Smokeless tobacco: Never  Vaping Use   Vaping status: Never Used  Substance and Sexual Activity   Alcohol use: No    Comment: occ   Drug use: No   Sexual activity: Yes  Other Topics Concern   Not on file  Social History Narrative   Not on file   Social Drivers of Health   Financial Resource Strain: Not on file  Food Insecurity: Not on file  Transportation Needs: Not on file  Physical Activity: Not on file  Stress: Not on file  Social Connections: Not on file    Review of Systems   Gen: Denies fever, chills, anorexia. Denies fatigue, weakness, weight loss.  CV: Denies chest pain, palpitations, syncope, peripheral edema, and claudication. Resp: Denies dyspnea at rest, cough, wheezing, coughing up blood, and pleurisy. GI: See HPI Derm: Denies rash, itching, dry skin Psych: Denies depression, anxiety, memory loss, confusion. No homicidal or suicidal ideation.  Heme: Denies bruising, bleeding, and enlarged lymph nodes.  Physical Exam   BP 112/70 (BP Location: Right Wrist, Patient Position: Sitting, Cuff Size: Normal)   Pulse 75   Temp 97.6 F (36.4 C) (Temporal)   Ht 5' 4 (1.626 m)   Wt (!) 319 lb 3.2 oz (144.8 kg)   BMI 54.79 kg/m   General:   Alert and oriented. No distress noted. Pleasant and cooperative.  Head:  Normocephalic and atraumatic. Eyes:  Conjuctiva clear without scleral icterus. Mouth:  Oral mucosa pink  and moist. Good dentition. No lesions. Lungs:  Clear to auscultation bilaterally. No wheezes, rales, or rhonchi. No distress.  Heart:  S1, S2 present without murmurs appreciated.  Abdomen:  +BS, soft, non-tender and non-distended. No rebound or guarding. No HSM or masses noted. Rectal: deferred Msk:  Symmetrical without gross deformities. Normal posture. Extremities:  Without  edema. Neurologic:  Alert and  oriented x4 Psych:  Alert and cooperative. Normal mood and affect.  Assessment & Plan  Heather Delgado is a 63 y.o. female presenting today for post procedure follow up.     Lymphocytic colitis Diagnosed via colonoscopy with significant improvement in diarrhea symptoms following budesonide  treatment. Currently experiencing 3-4 formed bowel movements per day, improved from frequent diarrhea.  - Continue budesonide  as prescribed for 8 weeks with the taper - Monitor bowel movement consistency and frequency. - Will reassess after completion of budesonide  taper.  Exocrine pancreatic insufficiency Low fecal elastase earlier this year. Symptoms improved with pancreatic enzyme supplementation (Zenpep  40K capsules). Likely idiopathic etiology, with no evidence of cancer or significant alcohol use. Improvement in fatigue and nutrient absorption noted with enzyme supplementation. Potential for mild improvement in enzyme secretion over time, but likely requires ongoing supplementation. - Continue Zenpep  40K capsules 1 with meals and snacks as prescribed. - Monitor for signs of constipation or worsening diarrhea. - Will consider increasing Zenpep  dose to 2 capsules with meals and 1 with snacks if symptoms persist after budesonide  taper given this is closer to her weight-based dosing.   Irritable bowel syndrome with diarrhea Long-standing IBS with diarrhea, now better managed with current treatment for lymphocytic colitis and pancreatic enzyme supplementation. No current need for dietary restrictions unless symptoms worsen. - Continue current management plan. - Provided handout on fiber content in foods to aid dietary planning.     Follow up   Follow up 3-4 months.   Follow up CT scheduled for 01/27/24.   Recall for TCS in 2030  Charmaine Melia, MSN, FNP-BC, AGACNP-BC [facility]

## 2024-01-17 ENCOUNTER — Encounter: Payer: Self-pay | Admitting: Gastroenterology

## 2024-01-17 ENCOUNTER — Ambulatory Visit: Admitting: Gastroenterology

## 2024-01-17 VITALS — BP 112/70 | HR 75 | Temp 97.6°F | Ht 64.0 in | Wt 319.2 lb

## 2024-01-17 DIAGNOSIS — K52832 Lymphocytic colitis: Secondary | ICD-10-CM | POA: Diagnosis not present

## 2024-01-17 DIAGNOSIS — K580 Irritable bowel syndrome with diarrhea: Secondary | ICD-10-CM

## 2024-01-17 DIAGNOSIS — K8681 Exocrine pancreatic insufficiency: Secondary | ICD-10-CM | POA: Diagnosis not present

## 2024-01-17 DIAGNOSIS — R933 Abnormal findings on diagnostic imaging of other parts of digestive tract: Secondary | ICD-10-CM

## 2024-01-17 NOTE — Patient Instructions (Addendum)
 Continue Zenpep  1 capsule with meals and 1 with snacks for now.  If after your off of the budesonide  you notice more frequency of stools or looser stools then start taking Zenpep  2 capsules with meals and 1 with snacks and let me know so I can update your prescription given this would be the most adequate dose for you.  I attached a handout to the back of your paperwork about fiber content and different types of foods.  For good colon health, the typically recommended amount of daily fiber is 25-35 g/day.  Continue taking your budesonide  as directed on your bottle for your taper.  Please let me know prior to your follow-up with me if you feel like things do not continue to improve as at that point we can also check your fat-soluble vitamins if you are having worsening fatigue etc.  Follow-up in 3-4 months.  It was a pleasure to see you today. I want to create trusting relationships with patients. If you receive a survey regarding your visit,  I greatly appreciate you taking time to fill this out on paper or through your MyChart. I value your feedback.  Charmaine Melia, MSN, FNP-BC, AGACNP-BC [facility]

## 2024-01-27 ENCOUNTER — Ambulatory Visit (HOSPITAL_COMMUNITY)
Admission: RE | Admit: 2024-01-27 | Discharge: 2024-01-27 | Disposition: A | Source: Ambulatory Visit | Attending: Gastroenterology

## 2024-01-27 DIAGNOSIS — R195 Other fecal abnormalities: Secondary | ICD-10-CM | POA: Insufficient documentation

## 2024-01-27 DIAGNOSIS — R1032 Left lower quadrant pain: Secondary | ICD-10-CM | POA: Insufficient documentation

## 2024-01-27 DIAGNOSIS — N200 Calculus of kidney: Secondary | ICD-10-CM | POA: Diagnosis not present

## 2024-01-27 DIAGNOSIS — K5730 Diverticulosis of large intestine without perforation or abscess without bleeding: Secondary | ICD-10-CM | POA: Diagnosis not present

## 2024-01-27 DIAGNOSIS — K625 Hemorrhage of anus and rectum: Secondary | ICD-10-CM | POA: Insufficient documentation

## 2024-01-27 MED ORDER — IOHEXOL 300 MG/ML  SOLN
100.0000 mL | Freq: Once | INTRAMUSCULAR | Status: AC | PRN
  Administered 2024-01-27: 100 mL via INTRAVENOUS

## 2024-02-04 NOTE — Progress Notes (Signed)
 CT Abdomen and pelvis  IMPRESSION: 1. No acute findings in the abdomen or pelvis. 2. Left colonic diverticulosis without evidence of diverticulitis. 3. Moderate stool burden throughout the colon.

## 2024-02-08 DIAGNOSIS — I872 Venous insufficiency (chronic) (peripheral): Secondary | ICD-10-CM | POA: Diagnosis not present

## 2024-03-14 ENCOUNTER — Other Ambulatory Visit: Payer: Self-pay | Admitting: Gastroenterology

## 2024-03-14 ENCOUNTER — Telehealth: Payer: Self-pay | Admitting: *Deleted

## 2024-03-14 DIAGNOSIS — K52832 Lymphocytic colitis: Secondary | ICD-10-CM

## 2024-03-14 MED ORDER — BUDESONIDE ER 9 MG PO TB24: 9.0000 mg | ORAL_TABLET | Freq: Every day | ORAL | 0 refills | Status: AC

## 2024-03-14 NOTE — Telephone Encounter (Signed)
 Pt called and states she has stated to have diarrhea again and would like to increase her dose of budesonide . Please advise. I informed her that you might send  her a MyChart message.

## 2024-03-15 NOTE — Telephone Encounter (Signed)
 Noted.

## 2024-03-29 ENCOUNTER — Telehealth: Payer: Self-pay | Admitting: *Deleted

## 2024-03-29 NOTE — Telephone Encounter (Signed)
 Received fax from [facility] [employer]~ [phone]~ telephone/ 1- [phone]~ fax.   Medical records requested: requested last date of service, next appointment date, and if provider is willing to complete disability forms  Medical Record Request ID: [id number]  Advised that patient last OV 07/24/2021. Provider will not complete diability forms at this time.

## 2024-04-11 ENCOUNTER — Ambulatory Visit: Admitting: Gastroenterology

## 2024-04-17 ENCOUNTER — Ambulatory Visit: Admitting: Gastroenterology

## 2024-04-25 ENCOUNTER — Encounter: Admitting: Obstetrics & Gynecology
